# Patient Record
Sex: Female | Born: 1955 | Race: White | Hispanic: No | Marital: Single | Smoking: Never smoker
Health system: Southern US, Community
[De-identification: ages and names within clinical notes are randomized; demographics above are authoritative.]

---

## 1997-11-22 ENCOUNTER — Other Ambulatory Visit: Admission: RE | Admit: 1997-11-22 | Discharge: 1997-11-22 | Payer: Self-pay | Admitting: Obstetrics and Gynecology

## 1998-01-19 ENCOUNTER — Other Ambulatory Visit: Admission: RE | Admit: 1998-01-19 | Discharge: 1998-01-19 | Payer: Self-pay | Admitting: Obstetrics and Gynecology

## 1998-12-30 ENCOUNTER — Other Ambulatory Visit: Admission: RE | Admit: 1998-12-30 | Discharge: 1998-12-30 | Payer: Self-pay | Admitting: Obstetrics and Gynecology

## 2000-02-05 ENCOUNTER — Other Ambulatory Visit: Admission: RE | Admit: 2000-02-05 | Discharge: 2000-02-05 | Payer: Self-pay | Admitting: Obstetrics and Gynecology

## 2001-02-14 ENCOUNTER — Other Ambulatory Visit: Admission: RE | Admit: 2001-02-14 | Discharge: 2001-02-14 | Payer: Self-pay | Admitting: Obstetrics and Gynecology

## 2002-02-24 ENCOUNTER — Other Ambulatory Visit: Admission: RE | Admit: 2002-02-24 | Discharge: 2002-02-24 | Payer: Self-pay | Admitting: Obstetrics and Gynecology

## 2003-04-06 ENCOUNTER — Other Ambulatory Visit: Admission: RE | Admit: 2003-04-06 | Discharge: 2003-04-06 | Payer: Self-pay | Admitting: Obstetrics and Gynecology

## 2003-05-17 ENCOUNTER — Other Ambulatory Visit: Admission: RE | Admit: 2003-05-17 | Discharge: 2003-05-17 | Payer: Self-pay | Admitting: Obstetrics and Gynecology

## 2003-11-07 ENCOUNTER — Emergency Department (HOSPITAL_COMMUNITY): Admission: EM | Admit: 2003-11-07 | Discharge: 2003-11-07 | Payer: Self-pay | Admitting: *Deleted

## 2003-11-08 ENCOUNTER — Ambulatory Visit (HOSPITAL_COMMUNITY): Admission: RE | Admit: 2003-11-08 | Discharge: 2003-11-08 | Payer: Self-pay | Admitting: *Deleted

## 2003-12-03 ENCOUNTER — Observation Stay (HOSPITAL_COMMUNITY): Admission: RE | Admit: 2003-12-03 | Discharge: 2003-12-04 | Payer: Self-pay | Admitting: General Surgery

## 2003-12-03 ENCOUNTER — Encounter (INDEPENDENT_AMBULATORY_CARE_PROVIDER_SITE_OTHER): Payer: Self-pay | Admitting: *Deleted

## 2004-04-21 ENCOUNTER — Other Ambulatory Visit: Admission: RE | Admit: 2004-04-21 | Discharge: 2004-04-21 | Payer: Self-pay | Admitting: Obstetrics and Gynecology

## 2005-05-21 ENCOUNTER — Other Ambulatory Visit: Admission: RE | Admit: 2005-05-21 | Discharge: 2005-05-21 | Payer: Self-pay | Admitting: Obstetrics and Gynecology

## 2006-06-03 ENCOUNTER — Other Ambulatory Visit: Admission: RE | Admit: 2006-06-03 | Discharge: 2006-06-03 | Payer: Self-pay | Admitting: Obstetrics and Gynecology

## 2006-07-08 ENCOUNTER — Emergency Department (HOSPITAL_COMMUNITY): Admission: EM | Admit: 2006-07-08 | Discharge: 2006-07-09 | Payer: Self-pay | Admitting: Emergency Medicine

## 2007-06-25 ENCOUNTER — Other Ambulatory Visit: Admission: RE | Admit: 2007-06-25 | Discharge: 2007-06-25 | Payer: Self-pay | Admitting: Obstetrics and Gynecology

## 2008-07-28 ENCOUNTER — Ambulatory Visit: Payer: Self-pay | Admitting: Obstetrics and Gynecology

## 2008-07-28 ENCOUNTER — Encounter: Payer: Self-pay | Admitting: Obstetrics and Gynecology

## 2008-07-28 ENCOUNTER — Other Ambulatory Visit: Admission: RE | Admit: 2008-07-28 | Discharge: 2008-07-28 | Payer: Self-pay | Admitting: Obstetrics and Gynecology

## 2008-08-02 ENCOUNTER — Ambulatory Visit: Payer: Self-pay | Admitting: Obstetrics and Gynecology

## 2009-08-18 ENCOUNTER — Other Ambulatory Visit: Admission: RE | Admit: 2009-08-18 | Discharge: 2009-08-18 | Payer: Self-pay | Admitting: Family Medicine

## 2009-12-02 ENCOUNTER — Emergency Department (HOSPITAL_COMMUNITY): Admission: EM | Admit: 2009-12-02 | Discharge: 2009-12-02 | Payer: Self-pay | Admitting: Emergency Medicine

## 2010-08-23 ENCOUNTER — Other Ambulatory Visit (HOSPITAL_COMMUNITY)
Admission: RE | Admit: 2010-08-23 | Discharge: 2010-08-23 | Disposition: A | Discharge status: Home / Self Care | Payer: BC Managed Care – PPO | Source: Ambulatory Visit | Attending: Family Medicine | Admitting: Family Medicine

## 2010-08-23 ENCOUNTER — Other Ambulatory Visit: Payer: Self-pay | Admitting: Family Medicine

## 2010-08-23 DIAGNOSIS — Z124 Encounter for screening for malignant neoplasm of cervix: Secondary | ICD-10-CM | POA: Insufficient documentation

## 2010-09-08 NOTE — Op Note (Signed)
NAME:  Amy Mcmahon, Amy Mcmahon NO.:  [id number]   MEDICAL RECORD NO.:  [id number]                   PATIENT TYPE:  OBV   LOCATION:  0480                                 FACILITY:  [facility]   PHYSICIAN:  Angelia Mould. Derrell Lolling, M.D.             DATE OF BIRTH:  1955/08/09   DATE OF PROCEDURE:  12/03/2003  DATE OF DISCHARGE:  12/04/2003                                 OPERATIVE REPORT   PREOPERATIVE DIAGNOSES:  Chronic cholecystitis with cholelithiasis.   POSTOPERATIVE DIAGNOSES:  Chronic cholecystitis with cholelithiasis.   OPERATION PERFORMED:  Laparoscopic cholecystectomy with intraoperative  cholangiogram.   SURGEON:  Dr. Claud Kelp   FIRST ASSISTANT:  Dr. Lorelee New   OPERATIVE INDICATIONS:  This is a 55 year old white female, who presented  with a 2 week history of daily episodes of postprandial epigastric pain.  She had a severe attack of pain and repeated vomiting several days prior to  evaluation, and she went to the emergency room where she had a white count  of 15,700, hemoglobin 12.3, total bilirubin of 2.1, slightly elevated liver  function tests, and normal lipase.  She was sent home and returned later for  an ultrasound on November 08, 2003, which showed numerous gallstones and normal  gallbladder wall thickening.  Dr. Lajean Manes asked me to see her.  She was  evaluated in the office recently.  She was brought to the operating room  electively.   OPERATIVE FINDINGS:  The gallbladder was chronically inflamed, had some  adhesions to it.  The cholangiogram was basically unremarkable and showed  normal intrahepatic and extrahepatic biliary anatomy.  Good opacification.  No filling defects.  Excellent flow of contrast into the duodenum.  The  gallbladder bed was a little bit raw at the end of the case, but bleeding  was well controlled, and there was no bile leak.   OPERATIVE TECHNIQUE:  This operative report is dictated in retrospect.  Exact  details of the surgery cannot be remembered.  This could not be  dictated on the day of surgery because the dictation system was broken.   Following the induction of general endotracheal anesthesia, the patient's  abdomen was prepped and draped in a sterile fashion.  A small incision was  made at the umbilicus.  The fascia was incised in the midline.  The  abdominal cavity was entered under direct vision.  A 10 mm Hasson trocar was  inserted and secured with a pursestring suture of 0 Vicryl.  Pneumoperitoneum was created.  Other than the abnormal gallbladder, we found  grossly abnormal findings.  A 10 mm trocar was placed in the subxiphoid  region and two 5 mm trocars placed in the right mid abdomen.  The  gallbladder fundus was elevated.  We took down all the adhesions from the  gallbladder.  We identified the infundibulum of the gallbladder and  retracted it laterally.  We dissected out the cystic duct and the cystic  artery.  The cystic artery was isolated as it went onto the wall of the  gallbladder, secured with metal clips, and divided.  A large window was  created behind the cystic duct.  A metal clip was placed on the cystic duct.  Cholangiogram catheter was inserted into the cystic duct and a cholangiogram  obtained using the C-arm.  This cholangiogram was completely normal as  described above.  No anatomic variance, no filling defects, and no  obstruction.  The cholangiogram catheter was removed.  The cystic duct was  secured with metal clips and divided.  The gallbladder was dissected from  its bed with electrocautery and removed through the umbilical port.  The  operative field was copiously irrigated.  Some minor oozing in the bed of  the gallbladder was controlled nicely with electrocautery.  At the  completion of the case, the irrigation fluid was completely clear, and there  was no bleeding and no bile leak whatsoever.  The trocars were removed under  direct vision, and  there was no bleeding from the trocar sites.  The  pneumoperitoneum was released.  The fascia at the umbilicus was closed with  0 Vicryl sutures.  Skin incisions were closed with subcuticular sutures of 4-  0 Vicryl and Steri-Strips.  Clean bandages were placed and the patient taken  to the recovery room in stable condition.  Estimated blood loss was about 20  mL.  Complications none.  Sponge, needle, and instrument counts were  correct.      HMI/MEDQ  D:  01/10/2004  T:  01/10/2004  Job:  [id number]

## 2011-05-16 ENCOUNTER — Ambulatory Visit (INDEPENDENT_AMBULATORY_CARE_PROVIDER_SITE_OTHER): Payer: BC Managed Care – PPO

## 2011-05-16 DIAGNOSIS — R1110 Vomiting, unspecified: Secondary | ICD-10-CM

## 2011-05-16 DIAGNOSIS — G43009 Migraine without aura, not intractable, without status migrainosus: Secondary | ICD-10-CM

## 2012-10-20 ENCOUNTER — Ambulatory Visit: Payer: BC Managed Care – PPO | Attending: Family Medicine | Admitting: Physical Therapy

## 2012-10-20 DIAGNOSIS — IMO0001 Reserved for inherently not codable concepts without codable children: Secondary | ICD-10-CM | POA: Insufficient documentation

## 2012-10-20 DIAGNOSIS — M546 Pain in thoracic spine: Secondary | ICD-10-CM | POA: Insufficient documentation

## 2012-10-20 DIAGNOSIS — M545 Low back pain, unspecified: Secondary | ICD-10-CM | POA: Insufficient documentation

## 2012-10-22 ENCOUNTER — Ambulatory Visit: Payer: BC Managed Care – PPO | Attending: Family Medicine | Admitting: Physical Therapy

## 2012-10-22 DIAGNOSIS — M545 Low back pain, unspecified: Secondary | ICD-10-CM | POA: Insufficient documentation

## 2012-10-22 DIAGNOSIS — M546 Pain in thoracic spine: Secondary | ICD-10-CM | POA: Insufficient documentation

## 2012-10-22 DIAGNOSIS — M4120 Other idiopathic scoliosis, site unspecified: Secondary | ICD-10-CM | POA: Insufficient documentation

## 2012-10-22 DIAGNOSIS — IMO0001 Reserved for inherently not codable concepts without codable children: Secondary | ICD-10-CM | POA: Insufficient documentation

## 2012-10-27 ENCOUNTER — Ambulatory Visit: Payer: BC Managed Care – PPO | Admitting: Physical Therapy

## 2012-10-29 ENCOUNTER — Encounter: Payer: BC Managed Care – PPO | Admitting: Physical Therapy

## 2012-10-29 ENCOUNTER — Ambulatory Visit: Payer: BC Managed Care – PPO

## 2012-10-30 ENCOUNTER — Encounter: Payer: BC Managed Care – PPO | Admitting: Physical Therapy

## 2012-11-03 ENCOUNTER — Encounter: Payer: BC Managed Care – PPO | Admitting: Physical Therapy

## 2012-11-05 ENCOUNTER — Ambulatory Visit: Payer: BC Managed Care – PPO | Admitting: Physical Therapy

## 2012-11-05 ENCOUNTER — Encounter: Payer: BC Managed Care – PPO | Admitting: Physical Therapy

## 2012-11-07 ENCOUNTER — Ambulatory Visit: Payer: BC Managed Care – PPO | Admitting: Physical Therapy

## 2012-11-10 ENCOUNTER — Ambulatory Visit: Payer: BC Managed Care – PPO | Admitting: Physical Therapy

## 2012-11-12 ENCOUNTER — Ambulatory Visit: Payer: BC Managed Care – PPO | Admitting: Physical Therapy

## 2012-11-17 ENCOUNTER — Ambulatory Visit: Payer: BC Managed Care – PPO | Admitting: Physical Therapy

## 2012-11-19 ENCOUNTER — Ambulatory Visit: Payer: BC Managed Care – PPO | Admitting: Physical Therapy

## 2012-11-24 ENCOUNTER — Ambulatory Visit: Payer: BC Managed Care – PPO | Attending: Family Medicine | Admitting: Physical Therapy

## 2012-11-24 DIAGNOSIS — M4120 Other idiopathic scoliosis, site unspecified: Secondary | ICD-10-CM | POA: Insufficient documentation

## 2012-11-24 DIAGNOSIS — M546 Pain in thoracic spine: Secondary | ICD-10-CM | POA: Insufficient documentation

## 2012-11-24 DIAGNOSIS — IMO0001 Reserved for inherently not codable concepts without codable children: Secondary | ICD-10-CM | POA: Insufficient documentation

## 2012-11-24 DIAGNOSIS — M545 Low back pain, unspecified: Secondary | ICD-10-CM | POA: Insufficient documentation

## 2013-05-10 ENCOUNTER — Encounter (HOSPITAL_COMMUNITY): Payer: Self-pay | Admitting: Emergency Medicine

## 2013-05-10 ENCOUNTER — Emergency Department (HOSPITAL_COMMUNITY)
Admission: EM | Admit: 2013-05-10 | Discharge: 2013-05-10 | Disposition: A | Discharge status: Home / Self Care | Payer: BC Managed Care – PPO | Source: Home / Self Care | Attending: Emergency Medicine | Admitting: Emergency Medicine

## 2013-05-10 DIAGNOSIS — G43909 Migraine, unspecified, not intractable, without status migrainosus: Secondary | ICD-10-CM

## 2013-05-10 MED ORDER — KETOROLAC TROMETHAMINE 30 MG/ML IJ SOLN
INTRAMUSCULAR | Status: AC
  Filled 2013-05-10: qty 1

## 2013-05-10 MED ORDER — DIPHENHYDRAMINE HCL 50 MG/ML IJ SOLN
25.0000 mg | Freq: Once | INTRAMUSCULAR | Status: AC
  Administered 2013-05-10: 25 mg via INTRAMUSCULAR

## 2013-05-10 MED ORDER — KETOROLAC TROMETHAMINE 30 MG/ML IJ SOLN: 30.0000 mg | Freq: Once | INTRAMUSCULAR | Status: DC

## 2013-05-10 MED ORDER — ONDANSETRON 4 MG PO TBDP
8.0000 mg | ORAL_TABLET | Freq: Once | ORAL | Status: AC
  Administered 2013-05-10: 8 mg via ORAL

## 2013-05-10 MED ORDER — ONDANSETRON 4 MG PO TBDP
ORAL_TABLET | ORAL | Status: AC
  Filled 2013-05-10: qty 2

## 2013-05-10 MED ORDER — KETOROLAC TROMETHAMINE 30 MG/ML IJ SOLN
30.0000 mg | Freq: Once | INTRAMUSCULAR | Status: AC
  Administered 2013-05-10: 30 mg via INTRAMUSCULAR

## 2013-05-10 MED ORDER — DIPHENHYDRAMINE HCL 50 MG/ML IJ SOLN
INTRAMUSCULAR | Status: AC
  Filled 2013-05-10: qty 1

## 2013-05-10 MED ORDER — ONDANSETRON 4 MG PO TBDP: ORAL_TABLET | ORAL | Status: AC

## 2013-05-10 NOTE — Discharge Instructions (Signed)
Migraine Headache A migraine headache is an intense, throbbing pain on one or both sides of your head. A migraine can last for 30 minutes to several hours. CAUSES  The exact cause of a migraine headache is not always known. However, a migraine may be caused when nerves in the brain become irritated and release chemicals that cause inflammation. This causes pain. Certain things may also trigger migraines, such as:  Alcohol.  Smoking.  Stress.  Menstruation.  Aged cheeses.  Foods or drinks that contain nitrates, glutamate, aspartame, or tyramine.  Lack of sleep.  Chocolate.  Caffeine.  Hunger.  Physical exertion.  Fatigue.  Medicines used to treat chest pain (nitroglycerine), birth control pills, estrogen, and some blood pressure medicines. SIGNS AND SYMPTOMS  Pain on one or both sides of your head.  Pulsating or throbbing pain.  Severe pain that prevents daily activities.  Pain that is aggravated by any physical activity.  Nausea, vomiting, or both.  Dizziness.  Pain with exposure to bright lights, loud noises, or activity.  General sensitivity to bright lights, loud noises, or smells. Before you get a migraine, you may get warning signs that a migraine is coming (aura). An aura may include:  Seeing flashing lights.  Seeing bright spots, halos, or zig-zag lines.  Having tunnel vision or blurred vision.  Having feelings of numbness or tingling.  Having trouble talking.  Having muscle weakness. DIAGNOSIS  A migraine headache is often diagnosed based on:  Symptoms.  Physical exam.  A CT scan or MRI of your head. These imaging tests cannot diagnose migraines, but they can help rule out other causes of headaches. TREATMENT Medicines may be given for pain and nausea. Medicines can also be given to help prevent recurrent migraines.  HOME CARE INSTRUCTIONS  Only take over-the-counter or prescription medicines for pain or discomfort as directed by your  health care provider. The use of long-term narcotics is not recommended.  Lie down in a dark, quiet room when you have a migraine.  Keep a journal to find out what may trigger your migraine headaches. For example, write down:  What you eat and drink.  How much sleep you get.  Any change to your diet or medicines.  Limit alcohol consumption.  Quit smoking if you smoke.  Get 7 9 hours of sleep, or as recommended by your health care provider.  Limit stress.  Keep lights dim if bright lights bother you and make your migraines worse. SEEK IMMEDIATE MEDICAL CARE IF:   Your migraine becomes severe.  You have a fever.  You have a stiff neck.  You have vision loss.  You have muscular weakness or loss of muscle control.  You start losing your balance or have trouble walking.  You feel faint or pass out.  You have severe symptoms that are different from your first symptoms. MAKE SURE YOU:   Understand these instructions.  Will watch your condition.  Will get help right away if you are not doing well or get worse. Document Released: 04/09/2005 Document Revised: 01/28/2013 Document Reviewed: 12/15/2012 [employer] Patient Information 2014 [employer].  

## 2013-05-10 NOTE — ED Notes (Signed)
Patient here for frontal head pressure that started yesterday Today it got worse with the pressure and vomiting States has a history of migraines but this is different  Her migraines are usually over her right eye This pressure is centered more near her sinus

## 2013-05-10 NOTE — ED Provider Notes (Signed)
CSN: [id number]     Arrival date & time 05/10/13  1833 History   First MD Initiated Contact with Patient 05/10/13 1914     Chief Complaint  Patient presents with  . Migraine   (Consider location/radiation/quality/duration/timing/severity/associated sxs/prior Treatment) HPI Comments: Frontal migraine HA that began yesterday. Patient to one dose of Maxalt today at 11:00am and phenergan suppository at 12:30pm today and continues to have headache, phonophobia, photophobia, nausea and vomiting. Denies changes in strength, sensation, vision, speech or balance.  PCP: Sadie Haber @ [place] Denies recent illness or injury  Patient is a 58 y.o. female presenting with migraines. The history is provided by the patient.  Migraine This is a recurrent problem. The current episode started yesterday. The problem occurs constantly. The problem has been gradually worsening.    No past medical history on file. No past surgical history on file. No family history on file. History  Substance Use Topics  . Smoking status: Not on file  . Smokeless tobacco: Not on file  . Alcohol Use: Not on file   OB History   No data available     Review of Systems  All other systems reviewed and are negative.    Allergies  Codeine  Home Medications   Current Outpatient Rx  Name  Route  Sig  Dispense  Refill  . imipramine (TOFRANIL) 50 MG tablet   Oral   Take 50 mg by mouth at bedtime.         . medroxyPROGESTERone (PROVERA) 2.5 MG tablet   Oral   Take 2.5 mg by mouth daily.         . metoprolol succinate (TOPROL-XL) 100 MG 24 hr tablet   Oral   Take 100 mg by mouth daily. Take with or immediately following a meal.         . promethazine (PHENERGAN) 25 MG tablet   Oral   Take 25 mg by mouth every 6 (six) hours as needed for nausea or vomiting.         . rizatriptan (MAXALT) 10 MG tablet   Oral   Take 10 mg by mouth as needed for migraine. May repeat in 2 hours if needed          BP  149/77  Pulse 75  Temp(Src) 97.3 F (36.3 C) (Oral)  Resp 18  SpO2 97% Physical Exam  Nursing note and vitals reviewed. Constitutional: She is oriented to person, place, and time. She appears well-developed and well-nourished. No distress.  HENT:  Head: Normocephalic and atraumatic.  Right Ear: Hearing, tympanic membrane, external ear and ear canal normal.  Left Ear: Hearing, tympanic membrane, external ear and ear canal normal.  Nose: Nose normal.  Mouth/Throat: Uvula is midline, oropharynx is clear and moist and mucous membranes are normal.  Eyes: Conjunctivae, EOM and lids are normal. Pupils are equal, round, and reactive to light. Right eye exhibits no discharge. Left eye exhibits no discharge. No scleral icterus.  Neck: Normal range of motion and full passive range of motion without pain. Neck supple.  Cardiovascular: Normal rate, regular rhythm and normal heart sounds.   Pulmonary/Chest: Effort normal and breath sounds normal.  Abdominal: Soft. Bowel sounds are normal. She exhibits no distension. There is no tenderness.  Musculoskeletal: Normal range of motion.  Lymphadenopathy:    She has no cervical adenopathy.  Neurological: She is alert and oriented to person, place, and time. She has normal strength. No cranial nerve deficit or sensory deficit. Coordination and gait normal.  GCS eye subscore is 4. GCS verbal subscore is 5. GCS motor subscore is 6.  Skin: Skin is warm and dry.  Psychiatric: She has a normal mood and affect. Her behavior is normal.    ED Course  Procedures (including critical care time) Labs Review Labs Reviewed - No data to display Imaging Review No results found.  EKG Interpretation    Date/Time:    Ventricular Rate:    PR Interval:    QRS Duration:   QT Interval:    QTC Calculation:   R Axis:     Text Interpretation:              MDM  Symptoms appear to be consistent  with patient's usual migraine headache process. No clinical  evidence of acute intracranial process.  Re-evaluation after IM Toradol, benadryl and ODT zofran: Patient reports near resolution of nausea. Is tolerating oral fluids. Headache improving. Has friend available to drive her home.  Will send with Rx for ODT zofran for use at home and advise follow up if symptoms return or if she is unable to keep clear liquids down despite zofran use.  Cautioned patient that if symptoms become suddenly worse or severe, she should report to her nearest ER for assistance.   [place], [place] 05/10/13 2009

## 2013-05-10 NOTE — ED Provider Notes (Signed)
Medical screening examination/treatment/procedure(s) were performed by non-physician practitioner and as supervising physician I was immediately available for consultation/collaboration.  Philipp Deputy, M.D.  Harden Mo, MD 05/10/13 2136

## 2013-08-09 ENCOUNTER — Encounter (HOSPITAL_COMMUNITY): Payer: Self-pay | Admitting: Emergency Medicine

## 2013-08-09 ENCOUNTER — Emergency Department (HOSPITAL_COMMUNITY)
Admission: EM | Admit: 2013-08-09 | Discharge: 2013-08-09 | Disposition: A | Discharge status: Home / Self Care | Payer: BC Managed Care – PPO | Source: Home / Self Care | Attending: Emergency Medicine | Admitting: Emergency Medicine

## 2013-08-09 DIAGNOSIS — G43909 Migraine, unspecified, not intractable, without status migrainosus: Secondary | ICD-10-CM

## 2013-08-09 MED ORDER — DIPHENHYDRAMINE HCL 50 MG/ML IJ SOLN
INTRAMUSCULAR | Status: AC
  Filled 2013-08-09: qty 1

## 2013-08-09 MED ORDER — KETOROLAC TROMETHAMINE 60 MG/2ML IM SOLN
60.0000 mg | Freq: Once | INTRAMUSCULAR | Status: AC
  Administered 2013-08-09: 60 mg via INTRAMUSCULAR

## 2013-08-09 MED ORDER — DIPHENHYDRAMINE HCL 50 MG/ML IJ SOLN
50.0000 mg | Freq: Once | INTRAMUSCULAR | Status: AC
  Administered 2013-08-09: 50 mg via INTRAMUSCULAR

## 2013-08-09 MED ORDER — ONDANSETRON 4 MG PO TBDP
ORAL_TABLET | ORAL | Status: AC
  Filled 2013-08-09: qty 1

## 2013-08-09 MED ORDER — KETOROLAC TROMETHAMINE 60 MG/2ML IM SOLN
INTRAMUSCULAR | Status: AC
  Filled 2013-08-09: qty 2

## 2013-08-09 MED ORDER — ONDANSETRON 4 MG PO TBDP
4.0000 mg | ORAL_TABLET | Freq: Once | ORAL | Status: AC
  Administered 2013-08-09: 4 mg via ORAL

## 2013-08-09 NOTE — ED Notes (Signed)
Pt comes in with c/o frontal migraine h/a that started this morning. States she has hx migraines. Takes Maxalt for sx.  States she took med on empty stomach with vomiting. Pt also took Zofran,Promethazine fro relief. 9/10 pounding h/a

## 2013-08-09 NOTE — ED Provider Notes (Signed)
CSN: [id number]     Arrival date & time 08/09/13  1220 History   First MD Initiated Contact with Patient 08/09/13 1501     Chief Complaint  Patient presents with  . 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 Woke up with typical migraine, took own maxalt but vomiting immediately afterward. Feels own meds not working. Requests toradol and something for nausea. LAst migraine 3 months ago.   Patient is a 58 y.o. female presenting with headaches. The history is provided by the patient.  Headache Pain location:  Frontal Quality:  Dull Radiates to: R eye. Severity currently:  9/10 Onset quality:  Gradual (woke up with migraine) Duration: since woke up this morning. Timing:  Constant Progression:  Unchanged Chronicity:  Recurrent Similar to prior headaches: yes   Context: bright light and loud noise   Context comment:  Smells Relieved by:  Nothing Worsened by:  Light and sound Ineffective treatments: own meds; maxalt, zofran, phenergan. Associated symptoms: nausea, photophobia and vomiting   Associated symptoms: no abdominal pain, no blurred vision, no congestion, no drainage, no fever, no numbness, no sinus pressure and no visual change     History reviewed. No pertinent past medical history. History reviewed. No pertinent past surgical history. History reviewed. No pertinent family history. History  Substance Use Topics  . Smoking status: Never Smoker   . Smokeless tobacco: Not on file  . Alcohol Use: Yes   OB History   Grav Para Term Preterm Abortions TAB SAB Ect Mult Living                 Review of Systems  Constitutional: Negative for fever and chills.  HENT: Negative for congestion, postnasal drip and sinus pressure.   Eyes: Positive for photophobia. Negative for blurred vision and  visual disturbance.  Gastrointestinal: Positive for nausea and vomiting. Negative for abdominal pain.  Neurological: Positive for headaches. Negative for numbness.    Allergies  Codeine  Home Medications   Prior to Admission medications   Medication Sig Start Date End Date Taking? Authorizing Provider  estradiol (VIVELLE-DOT) 0.025 MG/24HR Place 1 patch onto the skin 2 (two) times a week.   Yes Historical Provider, MD  medroxyPROGESTERone (PROVERA) 2.5 MG tablet Take 2.5 mg by mouth daily.   Yes Historical Provider, MD  metoprolol succinate (TOPROL-XL) 100 MG 24 hr tablet Take 100 mg by mouth daily. Take with or immediately following a meal.   Yes Historical Provider, MD  ondansetron (ZOFRAN ODT) 4 MG disintegrating tablet 1-2 tabs ODT q8hrs prn nausea/vomiting 05/10/13  Yes Annett Gula Presson, PA  promethazine (PHENERGAN) 25 MG tablet Take 25 mg by mouth every 6 (six) hours as needed for nausea or vomiting.   Yes Historical Provider, MD  rizatriptan (MAXALT) 10 MG tablet Take 10 mg by mouth as needed for migraine. May repeat in 2 hours if needed   Yes Historical Provider, MD  imipramine (TOFRANIL) 50 MG tablet Take 50 mg by mouth at bedtime.    Historical Provider, MD   BP 145/83  Pulse 72  Temp(Src) 97.8 F (36.6 C) (Oral)  Resp 16  SpO2 100% Physical Exam  Constitutional: She is oriented to person, place, and time. She appears well-developed and well-nourished.  Appears in pain; sitting in darkened exam room   Eyes: Conjunctivae and EOM are normal. Pupils are equal, round, and reactive to light.  Cardiovascular: Normal rate and regular rhythm.   Pulmonary/Chest: Effort normal and breath sounds normal.  Neurological: She is alert and oriented to person, place, and time. She has normal strength. Gait normal.    ED Course  Procedures (including critical care time) Labs Review Labs Reviewed - No data to display  No results found for this or any previous visit. Imaging  Review No results found.   MDM   1. Migraine   pt given toradol 60mg  IM, benadryl 50mg  IM, zofran odt 4mg  here at [facility]. Has ride home.      Carvel Getting, NP 08/09/13 [phone]

## 2013-08-09 NOTE — Discharge Instructions (Signed)
Migraine Headache A migraine headache is an intense, throbbing pain on one or both sides of your head. A migraine can last for 30 minutes to several hours. CAUSES  The exact cause of a migraine headache is not always known. However, a migraine may be caused when nerves in the brain become irritated and release chemicals that cause inflammation. This causes pain. Certain things may also trigger migraines, such as:  Alcohol.  Smoking.  Stress.  Menstruation.  Aged cheeses.  Foods or drinks that contain nitrates, glutamate, aspartame, or tyramine.  Lack of sleep.  Chocolate.  Caffeine.  Hunger.  Physical exertion.  Fatigue.  Medicines used to treat chest pain (nitroglycerine), birth control pills, estrogen, and some blood pressure medicines. SIGNS AND SYMPTOMS  Pain on one or both sides of your head.  Pulsating or throbbing pain.  Severe pain that prevents daily activities.  Pain that is aggravated by any physical activity.  Nausea, vomiting, or both.  Dizziness.  Pain with exposure to bright lights, loud noises, or activity.  General sensitivity to bright lights, loud noises, or smells. Before you get a migraine, you may get warning signs that a migraine is coming (aura). An aura may include:  Seeing flashing lights.  Seeing bright spots, halos, or zig-zag lines.  Having tunnel vision or blurred vision.  Having feelings of numbness or tingling.  Having trouble talking.  Having muscle weakness. DIAGNOSIS  A migraine headache is often diagnosed based on:  Symptoms.  Physical exam.  A CT scan or MRI of your head. These imaging tests cannot diagnose migraines, but they can help rule out other causes of headaches. TREATMENT Medicines may be given for pain and nausea. Medicines can also be given to help prevent recurrent migraines.  HOME CARE INSTRUCTIONS  Only take over-the-counter or prescription medicines for pain or discomfort as directed by your  health care provider. The use of long-term narcotics is not recommended.  Lie down in a dark, quiet room when you have a migraine.  Keep a journal to find out what may trigger your migraine headaches. For example, write down:  What you eat and drink.  How much sleep you get.  Any change to your diet or medicines.  Limit alcohol consumption.  Quit smoking if you smoke.  Get 7 9 hours of sleep, or as recommended by your health care provider.  Limit stress.  Keep lights dim if bright lights bother you and make your migraines worse. SEEK IMMEDIATE MEDICAL CARE IF:   Your migraine becomes severe.  You have a fever.  You have a stiff neck.  You have vision loss.  You have muscular weakness or loss of muscle control.  You start losing your balance or have trouble walking.  You feel faint or pass out.  You have severe symptoms that are different from your first symptoms. MAKE SURE YOU:   Understand these instructions.  Will watch your condition.  Will get help right away if you are not doing well or get worse. Document Released: 04/09/2005 Document Revised: 01/28/2013 Document Reviewed: 12/15/2012 [employer] Patient Information 2014 [employer].  

## 2013-08-10 NOTE — ED Provider Notes (Signed)
Medical screening examination/treatment/procedure(s) were performed by non-physician practitioner and as supervising physician I was immediately available for consultation/collaboration.  Philipp Deputy, M.D.  Harden Mo, MD 08/10/13 [phone]

## 2013-10-19 ENCOUNTER — Other Ambulatory Visit (HOSPITAL_COMMUNITY)
Admission: RE | Admit: 2013-10-19 | Discharge: 2013-10-19 | Disposition: A | Discharge status: Home / Self Care | Payer: BC Managed Care – PPO | Source: Ambulatory Visit | Attending: Family Medicine | Admitting: Family Medicine

## 2013-10-19 ENCOUNTER — Other Ambulatory Visit: Payer: Self-pay | Admitting: Family Medicine

## 2013-10-19 DIAGNOSIS — Z1151 Encounter for screening for human papillomavirus (HPV): Secondary | ICD-10-CM | POA: Insufficient documentation

## 2013-10-19 DIAGNOSIS — Z124 Encounter for screening for malignant neoplasm of cervix: Secondary | ICD-10-CM | POA: Insufficient documentation

## 2013-10-20 LAB — CYTOLOGY - PAP

## 2015-10-26 ENCOUNTER — Other Ambulatory Visit: Payer: Self-pay | Admitting: Family Medicine

## 2015-10-26 DIAGNOSIS — J3489 Other specified disorders of nose and nasal sinuses: Secondary | ICD-10-CM

## 2015-10-27 ENCOUNTER — Ambulatory Visit
Admission: RE | Admit: 2015-10-27 | Discharge: 2015-10-27 | Disposition: A | Discharge status: Home / Self Care | Payer: BC Managed Care – PPO | Source: Ambulatory Visit | Attending: Family Medicine | Admitting: Family Medicine

## 2015-10-27 DIAGNOSIS — J3489 Other specified disorders of nose and nasal sinuses: Secondary | ICD-10-CM

## 2016-11-13 ENCOUNTER — Other Ambulatory Visit: Payer: Self-pay | Admitting: Family Medicine

## 2016-11-13 ENCOUNTER — Other Ambulatory Visit (HOSPITAL_COMMUNITY)
Admission: RE | Admit: 2016-11-13 | Discharge: 2016-11-13 | Disposition: A | Discharge status: Home / Self Care | Payer: BC Managed Care – PPO | Source: Ambulatory Visit | Attending: Family Medicine | Admitting: Family Medicine

## 2016-11-13 DIAGNOSIS — Z1151 Encounter for screening for human papillomavirus (HPV): Secondary | ICD-10-CM | POA: Diagnosis present

## 2016-11-13 DIAGNOSIS — Z01411 Encounter for gynecological examination (general) (routine) with abnormal findings: Secondary | ICD-10-CM | POA: Diagnosis present

## 2016-11-14 LAB — CYTOLOGY - PAP
DIAGNOSIS: NEGATIVE
HPV: NOT DETECTED

## 2017-09-08 IMAGING — CT CT MAXILLOFACIAL W/O CM
4 of 5 series · 17 of 37 positions shown, 19 images · non-contrast
Comparison: None.

CLINICAL DATA: Hit in the face by a dog 3 weeks ago with nasal pain
and swelling, some vertigo

EXAM:
CT MAXILLOFACIAL WITHOUT CONTRAST
TECHNIQUE: Multidetector CT imaging of the maxillofacial structures was
performed. Multiplanar CT image reconstructions were also generated.
A small metallic BB was placed on the right temple in order to
reliably differentiate right from left.

[Series 4: max bone · axial · 0.33mm/px · z∈[-78,+37]mm · 5 of 70 slices shown, 7 images]
[im 12/70  brain]
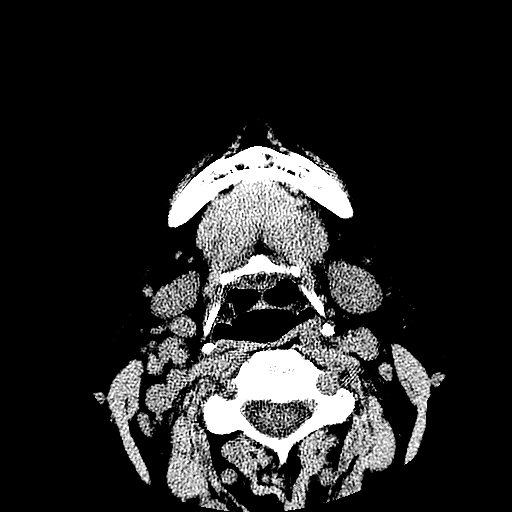
[im 12/70  bone]
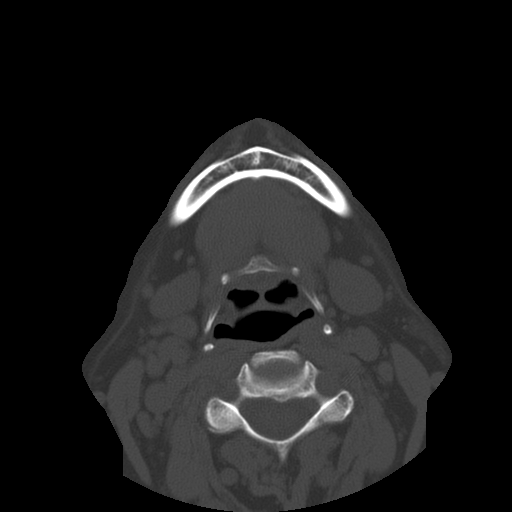
[im 24/70  bone]
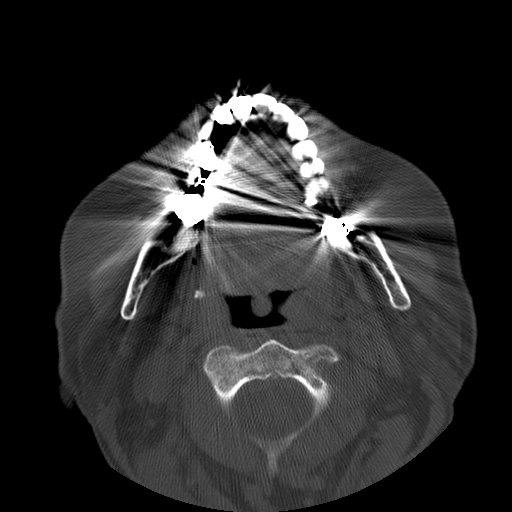
[im 35/70  bone]
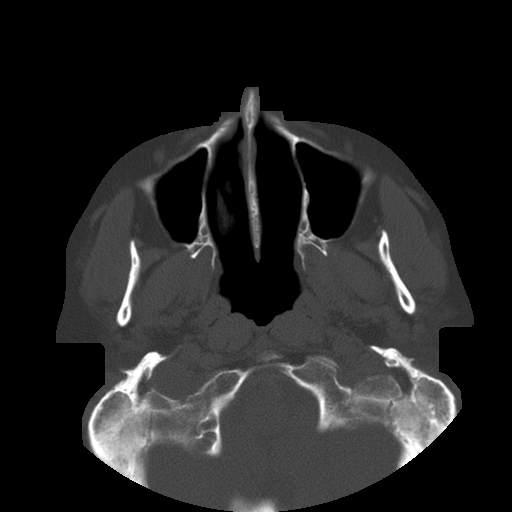
[im 47/70  bone]
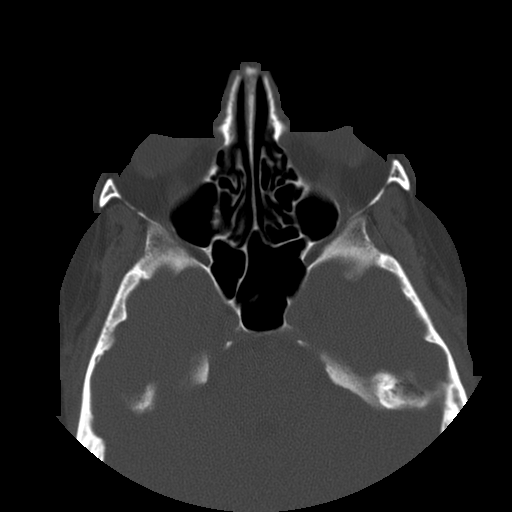
[im 58/70  brain]
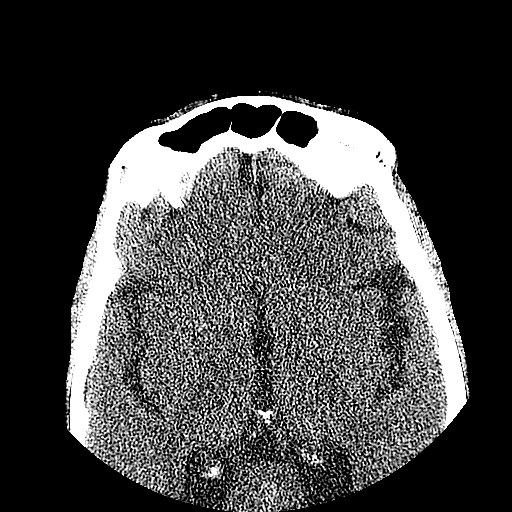
[im 58/70  bone]
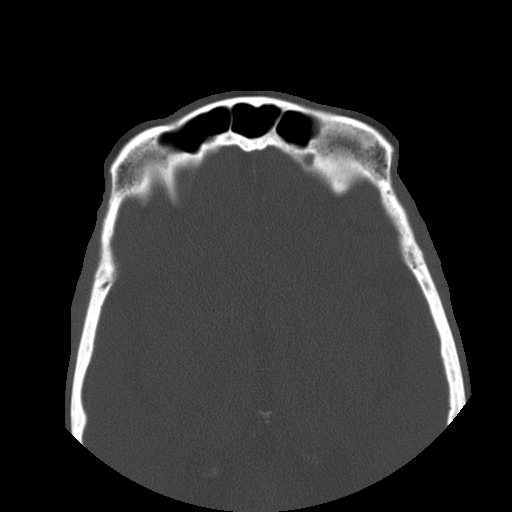

[Series 601: coronal facial · coronal · 0.34mm/px · 3 of 84 slices shown]
[im 28/84  bone]
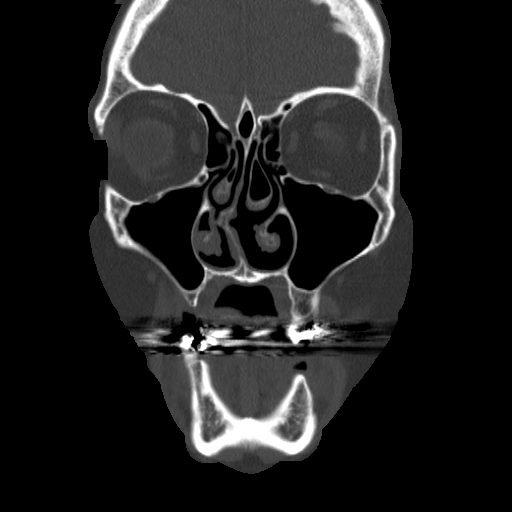
[im 42/84  bone]
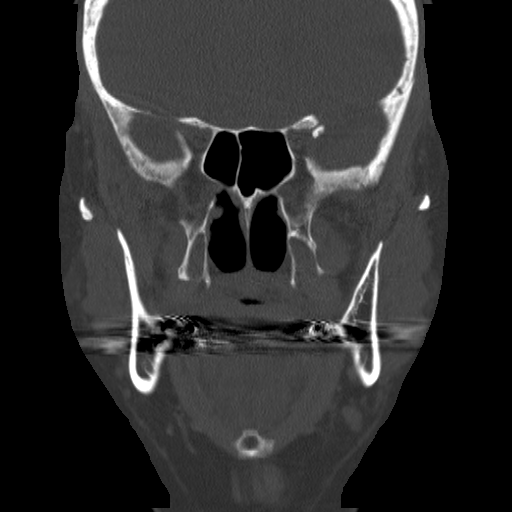
[im 56/84  bone]
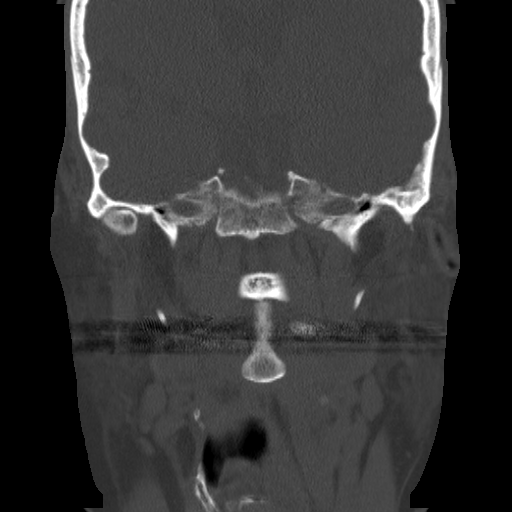

[Series 604: cor st facial · coronal · 0.34mm/px · 6 of 84 slices shown]
[im 12/84  bone]
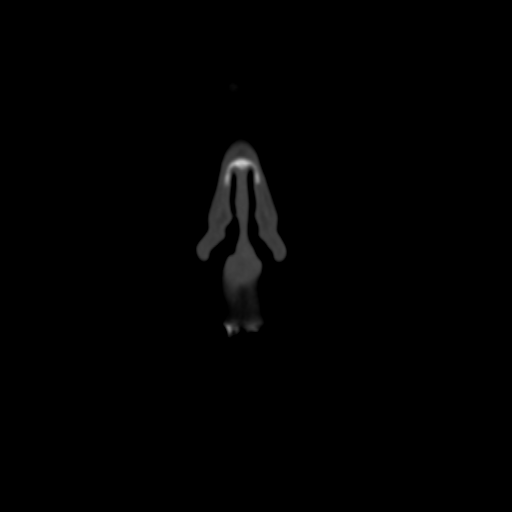
[im 24/84  bone]
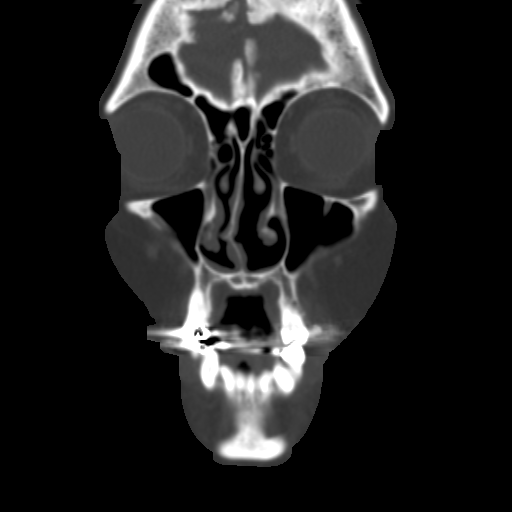
[im 36/84  bone]
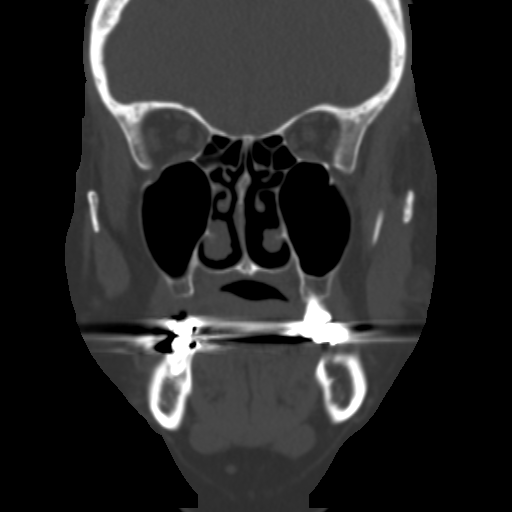
[im 48/84  bone]
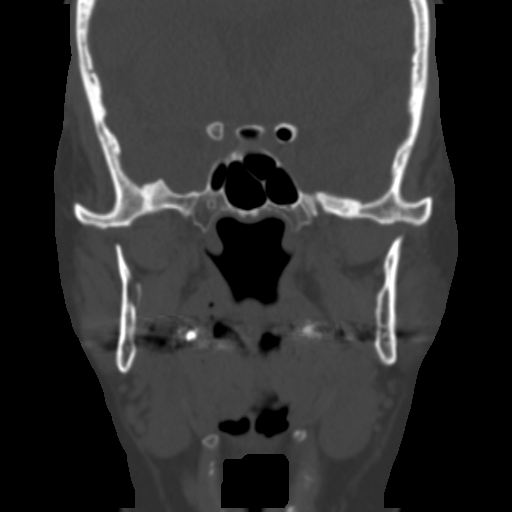
[im 60/84  bone]
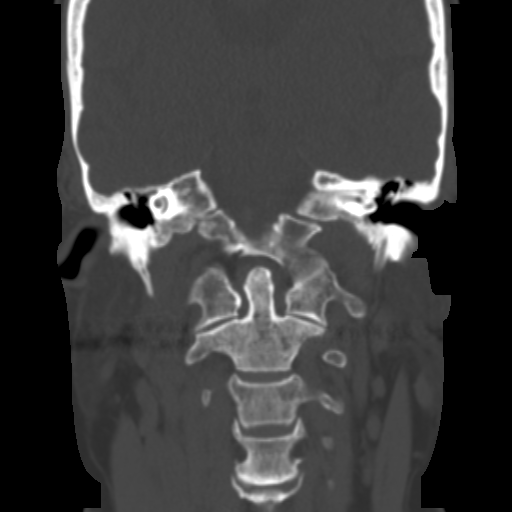
[im 72/84  bone]
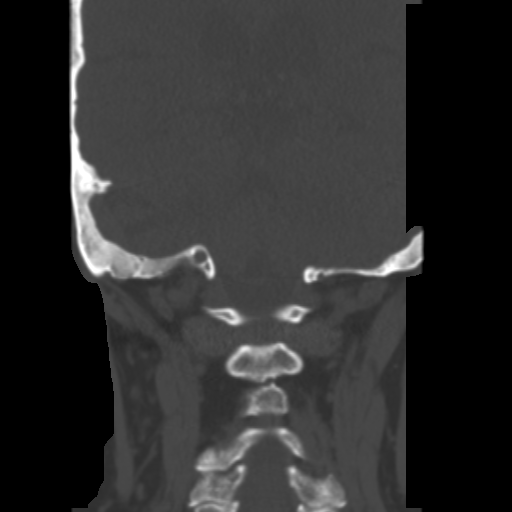

[Series 605: sag st facial · sagittal · 0.34mm/px · 3 of 77 slices shown]
[im 11/77  bone]
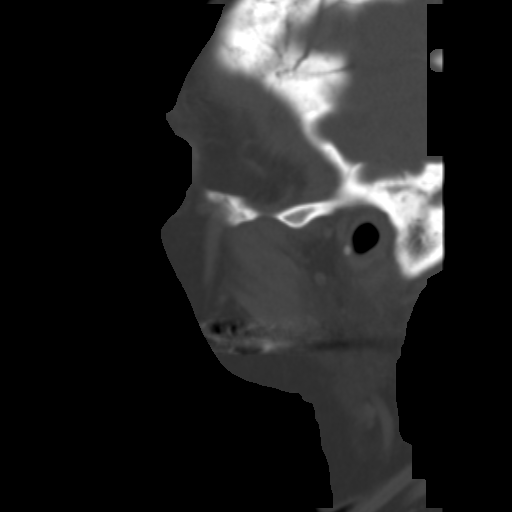
[im 22/77  bone]
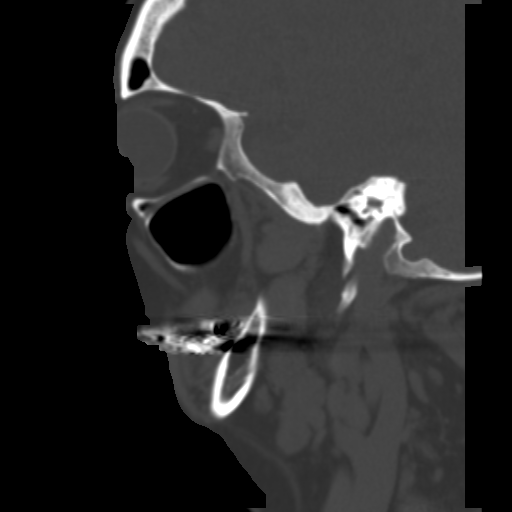
[im 33/77  bone]
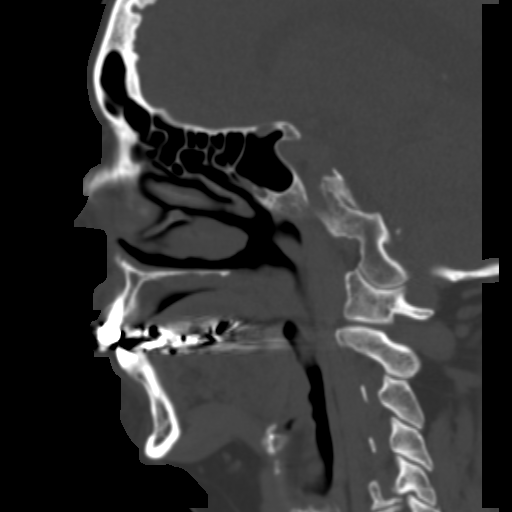

[17 of 37 positions shown; findings below may reference images not displayed]

FINDINGS: The paranasal sinuses are well pneumatized. No air-fluid level or
mucosal thickening is seen. The nasal septum deviates to the right
of midline compromising the right nasal airway somewhat but the left
nasal airway is widely patent. There is pneumatization of the left
middle terminate. No nasal bone fracture is seen, and no
maxillofacial fracture is evident. The mandible appears intact and
the mandibular condyles are in normal position with some
degenerative change present.
IMPRESSION: 1. No maxillofacial fracture is seen.
2. The paranasal sinuses are well pneumatized.
3. Compromise of the right nasal airway by nasal septal deviation to
the right of midline.

## 2017-10-14 ENCOUNTER — Telehealth: Payer: Self-pay | Admitting: Hematology

## 2017-10-14 ENCOUNTER — Encounter: Payer: Self-pay | Admitting: Hematology

## 2017-10-14 NOTE — Telephone Encounter (Signed)
New referral received from Dr. Orland Mustard at [facility] for dx of thrombocytosis. Pt has been scheduled to see Dr. Irene Limbo on 7/10 at 1pm. Pt aware to arrive 30 minutes early. Letter mailed.

## 2017-10-28 NOTE — Progress Notes (Signed)
HEMATOLOGY/ONCOLOGY CONSULTATION NOTE  Date of Service: 10/30/2017  Patient Care Team: Maurice Small, MD as PCP - General (Family Medicine)  CHIEF COMPLAINTS/PURPOSE OF CONSULTATION:  Thrombocytosis  HISTORY OF PRESENTING ILLNESS:   Amy Mcmahon is a wonderful 62 y.o. female who has been referred to [place] by Dr Maurice Small for evaluation and management of Thrombocytosis. The pt reports that she is doing well overall.   The pt reports that she has not previously had elevated platelets to her knowledge, and has had annual physicals with labs. She denies feeling any differently in the past 6 months as compared to a year ago. She had some diarrhea at the time of her last labs, and received empirical antibiotics. She notes that her her diarrhea resolved after a few days and denies recent surgeries, other recent infections. She adds that her bowel movements have occasionally been very loose, having recurring episodes of diarrhea. She notes some association of her GI symptoms with tomato sauce.   She had an endoscopy in the last month, which revealed two stomach ulcers that were negative for H. Pylori and she takes 40mg  Prilosec BID.   She uses an estrogen patch and has used Provera for about 7 years. She denies taking any steroids recently. She notes that her PCP Dr Justin Mend is manage her estrogen replacement.   She notes that her migraines used to occur prior to beginning her periods. She has used Metoprolol to treat her migraines and Maxalt as well. She adds that her migraines have lessened as she has gotten older.   She notes a history of iron deficiency anemia when she was very young.   Most recent lab results (10/09/17) of CBC  is as follows: all values are WNL except for PLT at 578k, MPV at 7.2.  On review of systems, pt reports intermittent diarrhea, good energy levels, and denies new pain along the spine, abdominal pains, leg swelling, and any other symptoms.   On PMHx the pt reports  menstrual migraines, insomnia, lumbar scoliosis, disc herniation, peptic ulcer, GERD, iron deficiency, cholecystectomy and denies splenectomy, and any blood clots. On Social Hx the pt denies ever smoking.  On Family Hx the pt reports cousin in 80s with high platelets. Mother with stroke at 53 and Father with strokes at 53 and 93, both parents in the setting of HTN.  MEDICAL HISTORY:  History reviewed. No pertinent past medical history.  SURGICAL HISTORY: History reviewed. No pertinent surgical history.  SOCIAL HISTORY: Social History   Socioeconomic History  . Marital status: Single    Spouse name: Not on file  . Number of children: Not on file  . Years of education: Not on file  . Highest education level: Not on file  Occupational History  . Not on file  Social Needs  . Financial resource strain: Not on file  . Food insecurity:    Worry: Not on file    Inability: Not on file  . Transportation needs:    Medical: Not on file    Non-medical: Not on file  Tobacco Use  . Smoking status: Never Smoker  . Smokeless tobacco: Never Used  Substance and Sexual Activity  . Alcohol use: Yes    Comment: social  . Drug use: Never  . Sexual activity: Not Currently  Lifestyle  . Physical activity:    Days per week: Not on file    Minutes per session: Not on file  . Stress: Not on file  Relationships  .  Social connections:    Talks on phone: Not on file    Gets together: Not on file    Attends religious service: Not on file    Active member of club or organization: Not on file    Attends meetings of clubs or organizations: Not on file    Relationship status: Not on file  . Intimate partner violence:    Fear of current or ex partner: Not on file    Emotionally abused: Not on file    Physically abused: Not on file    Forced sexual activity: Not on file  Other Topics Concern  . Not on file  Social History Narrative  . Not on file    FAMILY HISTORY: History reviewed. No  pertinent family history.  ALLERGIES:  is allergic to codeine.  MEDICATIONS:  Current Outpatient Medications  Medication Sig Dispense Refill  . omeprazole (PRILOSEC) 40 MG capsule Take 40 mg by mouth daily.    Marland Kitchen estradiol (VIVELLE-DOT) 0.025 MG/24HR Place 1 patch onto the skin once a week.     Marland Kitchen imipramine (TOFRANIL) 50 MG tablet Take 50 mg by mouth at bedtime.    . medroxyPROGESTERone (PROVERA) 2.5 MG tablet Take 2.5 mg by mouth daily.    . metoprolol succinate (TOPROL-XL) 100 MG 24 hr tablet Take 100 mg by mouth daily. Take with or immediately following a meal.    . ondansetron (ZOFRAN ODT) 4 MG disintegrating tablet 1-2 tabs ODT q8hrs prn nausea/vomiting 20 tablet 0  . promethazine (PHENERGAN) 25 MG tablet Take 25 mg by mouth every 6 (six) hours as needed for nausea or vomiting.    . rizatriptan (MAXALT) 10 MG tablet Take 10 mg by mouth as needed for migraine. May repeat in 2 hours if needed     No current facility-administered medications for this visit.     REVIEW OF SYSTEMS:    10 Point review of Systems was done is negative except as noted above.  PHYSICAL EXAMINATION:  . Vitals:   10/30/17 1307  BP: (!) 143/76  Pulse: 64  Resp: 18  Temp: 98.6 F (37 C)  SpO2: 99%   Filed Weights   10/30/17 1307  Weight: 160 lb 4.8 oz (72.7 kg)   .Body mass index is 27.52 kg/m.  GENERAL:alert, in no acute distress and comfortable SKIN: no acute rashes, no significant lesions EYES: conjunctiva are pink and non-injected, sclera anicteric OROPHARYNX: MMM, no exudates, no oropharyngeal erythema or ulceration NECK: supple, no JVD LYMPH:  no palpable lymphadenopathy in the cervical, axillary or inguinal regions LUNGS: clear to auscultation b/l with normal respiratory effort HEART: regular rate & rhythm ABDOMEN:  normoactive bowel sounds , non tender, not distended. Extremity: no pedal edema PSYCH: alert & oriented x 3 with fluent speech NEURO: no focal motor/sensory  deficits  LABORATORY DATA:  I have reviewed the data as listed  . CBC Latest Ref Rng & Units 10/30/2017  WBC 3.9 - 10.3 K/uL 7.1  Hemoglobin 11.6 - 15.9 g/dL 13.2  Hematocrit 34.8 - 46.6 % 39.8  Platelets 145 - 400 K/uL 526(H)    . CMP Latest Ref Rng & Units 10/30/2017  Glucose 70 - 99 mg/dL 89  BUN 8 - 23 mg/dL 15  Creatinine 0.44 - 1.00 mg/dL 0.75  Sodium 135 - 145 mmol/L 141  Potassium 3.5 - 5.1 mmol/L 3.9  Chloride 98 - 111 mmol/L 106  CO2 22 - 32 mmol/L 28  Calcium 8.9 - 10.3 mg/dL 8.9  Total Protein  6.5 - 8.1 g/dL 7.5  Total Bilirubin 0.3 - 1.2 mg/dL 0.8  Alkaline Phos 38 - 126 U/L 53  AST 15 - 41 U/L 15  ALT 0 - 44 U/L 14   . Lab Results  Component Value Date   IRON 74 10/30/2017   TIBC 321 10/30/2017   IRONPCTSAT 23 10/30/2017   (Iron and TIBC)  Lab Results  Component Value Date   FERRITIN 45 10/30/2017      10/09/17 CBC w/diff:     RADIOGRAPHIC STUDIES: I have personally reviewed the radiological images as listed and agreed with the findings in the report. No results found.  ASSESSMENT & PLAN:  62 y.o. female with  1. Thrombocytosis Plan -Discussed patient's most recent labs from 10/09/17, PLT at 578k -No smoking history, reactive process?  -Will check for myeloproliferative disorders to r/o Essential Thrombocytosis -Discussed the increased risk of blood clots associated with thrombocytosis -Will collect blood tests today and send out genetic tests-sent results pending. -Recommend that pt and PCP discuss benefits of estrogen replacement as this can increase the risk of blood clots -Continue follow up with PCP for age appropriate cancer screening   2.Iron deficiency . Lab Results  Component Value Date   IRON 74 10/30/2017   TIBC 321 10/30/2017   IRONPCTSAT 23 10/30/2017   (Iron and TIBC)  Lab Results  Component Value Date   FERRITIN 45 10/30/2017   Labs today RTC with Dr Irene Limbo as per labs   All of the patients questions were  answered with apparent satisfaction. The patient knows to call the clinic with any problems, questions or concerns.  The total time spent in the appt was 40 minutes and more than 50% was on counseling and direct patient cares.    Sullivan Lone MD MS AAHIVMS [facility] Hematology/Oncology Physician [facility]  (Office):       [phone] (Work cell):  [phone] (Fax):           [phone]  10/30/2017 1:48 PM  I, Baldwin [place], am acting as a Education administrator for Dr Irene Limbo.   .I have reviewed the above documentation for accuracy and completeness, and I agree with the above. Brunetta Genera MD

## 2017-10-30 ENCOUNTER — Encounter: Payer: Self-pay | Admitting: Hematology

## 2017-10-30 ENCOUNTER — Inpatient Hospital Stay: Payer: BC Managed Care – PPO

## 2017-10-30 ENCOUNTER — Inpatient Hospital Stay: Payer: BC Managed Care – PPO | Attending: Hematology | Admitting: Hematology

## 2017-10-30 VITALS — BP 143/76 | HR 64 | Temp 98.6°F | Resp 18 | Ht 64.0 in | Wt 160.3 lb

## 2017-10-30 DIAGNOSIS — G4700 Insomnia, unspecified: Secondary | ICD-10-CM | POA: Diagnosis not present

## 2017-10-30 DIAGNOSIS — K219 Gastro-esophageal reflux disease without esophagitis: Secondary | ICD-10-CM

## 2017-10-30 DIAGNOSIS — D473 Essential (hemorrhagic) thrombocythemia: Secondary | ICD-10-CM | POA: Diagnosis not present

## 2017-10-30 DIAGNOSIS — M419 Scoliosis, unspecified: Secondary | ICD-10-CM | POA: Insufficient documentation

## 2017-10-30 DIAGNOSIS — D509 Iron deficiency anemia, unspecified: Secondary | ICD-10-CM | POA: Diagnosis not present

## 2017-10-30 DIAGNOSIS — R197 Diarrhea, unspecified: Secondary | ICD-10-CM | POA: Insufficient documentation

## 2017-10-30 DIAGNOSIS — D75839 Thrombocytosis, unspecified: Secondary | ICD-10-CM

## 2017-10-30 DIAGNOSIS — Z823 Family history of stroke: Secondary | ICD-10-CM | POA: Diagnosis not present

## 2017-10-30 DIAGNOSIS — Z8249 Family history of ischemic heart disease and other diseases of the circulatory system: Secondary | ICD-10-CM | POA: Insufficient documentation

## 2017-10-30 DIAGNOSIS — Z79899 Other long term (current) drug therapy: Secondary | ICD-10-CM

## 2017-10-30 LAB — CBC WITH DIFFERENTIAL/PLATELET
BASOS ABS: 0 10*3/uL (ref 0.0–0.1)
Basophils Relative: 1 %
EOS PCT: 3 %
Eosinophils Absolute: 0.2 10*3/uL (ref 0.0–0.5)
HCT: 39.8 % (ref 34.8–46.6)
Hemoglobin: 13.2 g/dL (ref 11.6–15.9)
LYMPHS ABS: 1.6 10*3/uL (ref 0.9–3.3)
LYMPHS PCT: 22 %
MCH: 28.8 pg (ref 25.1–34.0)
MCHC: 33.2 g/dL (ref 31.5–36.0)
MCV: 86.9 fL (ref 79.5–101.0)
MONO ABS: 0.9 10*3/uL (ref 0.1–0.9)
Monocytes Relative: 13 %
Neutro Abs: 4.4 10*3/uL (ref 1.5–6.5)
Neutrophils Relative %: 61 %
PLATELETS: 526 10*3/uL — AB (ref 145–400)
RBC: 4.58 MIL/uL (ref 3.70–5.45)
RDW: 13.7 % (ref 11.2–14.5)
WBC: 7.1 10*3/uL (ref 3.9–10.3)

## 2017-10-30 LAB — IRON AND TIBC
Iron: 74 ug/dL (ref 41–142)
Saturation Ratios: 23 % (ref 21–57)
TIBC: 321 ug/dL (ref 236–444)
UIBC: 247 ug/dL

## 2017-10-30 LAB — CMP (CANCER CENTER ONLY)
ALBUMIN: 3.8 g/dL (ref 3.5–5.0)
ALK PHOS: 53 U/L (ref 38–126)
ALT: 14 U/L (ref 0–44)
AST: 15 U/L (ref 15–41)
Anion gap: 7 (ref 5–15)
BUN: 15 mg/dL (ref 8–23)
CALCIUM: 8.9 mg/dL (ref 8.9–10.3)
CHLORIDE: 106 mmol/L (ref 98–111)
CO2: 28 mmol/L (ref 22–32)
CREATININE: 0.75 mg/dL (ref 0.44–1.00)
GFR, Est AFR Am: >60 mL/min (ref >60)
GFR, Estimated: >60 mL/min (ref >60)
GLUCOSE: 89 mg/dL (ref 70–99)
Potassium: 3.9 mmol/L (ref 3.5–5.1)
SODIUM: 141 mmol/L (ref 135–145)
Total Bilirubin: 0.8 mg/dL (ref 0.3–1.2)
Total Protein: 7.5 g/dL (ref 6.5–8.1)

## 2017-10-30 LAB — RETICULOCYTES
RBC.: 4.58 MIL/uL (ref 3.70–5.45)
Retic Count, Absolute: 45.8 10*3/uL (ref 33.7–90.7)
Retic Ct Pct: 1 % (ref 0.7–2.1)

## 2017-10-30 LAB — SEDIMENTATION RATE: Sed Rate: 15 mm/hr (ref 0–22)

## 2017-10-30 LAB — FERRITIN: FERRITIN: 45 ng/mL (ref 11–307)

## 2017-11-22 LAB — JAK2 (INCLUDING V617F AND EXON 12), MPL,& CALR W/RFL MPN PANEL (NGS)

## 2018-12-15 ENCOUNTER — Other Ambulatory Visit: Payer: Self-pay | Admitting: Radiology

## 2020-05-06 ENCOUNTER — Other Ambulatory Visit: Payer: Self-pay | Admitting: Family Medicine

## 2020-05-06 ENCOUNTER — Ambulatory Visit
Admission: RE | Admit: 2020-05-06 | Discharge: 2020-05-06 | Disposition: A | Discharge status: Home / Self Care | Payer: Medicare PPO | Source: Ambulatory Visit | Attending: Family Medicine | Admitting: Family Medicine

## 2020-05-06 ENCOUNTER — Other Ambulatory Visit: Payer: Self-pay

## 2020-05-06 DIAGNOSIS — R52 Pain, unspecified: Secondary | ICD-10-CM

## 2020-08-18 DIAGNOSIS — K640 First degree hemorrhoids: Secondary | ICD-10-CM | POA: Diagnosis not present

## 2020-08-18 DIAGNOSIS — Z1211 Encounter for screening for malignant neoplasm of colon: Secondary | ICD-10-CM | POA: Diagnosis not present

## 2020-09-06 DIAGNOSIS — R1310 Dysphagia, unspecified: Secondary | ICD-10-CM | POA: Diagnosis not present

## 2020-09-06 DIAGNOSIS — K222 Esophageal obstruction: Secondary | ICD-10-CM | POA: Diagnosis not present

## 2020-11-01 DIAGNOSIS — K222 Esophageal obstruction: Secondary | ICD-10-CM | POA: Diagnosis not present

## 2020-11-01 DIAGNOSIS — R1310 Dysphagia, unspecified: Secondary | ICD-10-CM | POA: Diagnosis not present

## 2021-01-23 DIAGNOSIS — Z1231 Encounter for screening mammogram for malignant neoplasm of breast: Secondary | ICD-10-CM | POA: Diagnosis not present

## 2021-02-07 DIAGNOSIS — Z79899 Other long term (current) drug therapy: Secondary | ICD-10-CM | POA: Diagnosis not present

## 2021-02-07 DIAGNOSIS — D473 Essential (hemorrhagic) thrombocythemia: Secondary | ICD-10-CM | POA: Diagnosis not present

## 2021-02-07 DIAGNOSIS — I491 Atrial premature depolarization: Secondary | ICD-10-CM | POA: Diagnosis not present

## 2021-02-07 DIAGNOSIS — Z23 Encounter for immunization: Secondary | ICD-10-CM | POA: Diagnosis not present

## 2021-02-07 DIAGNOSIS — Z1159 Encounter for screening for other viral diseases: Secondary | ICD-10-CM | POA: Diagnosis not present

## 2021-02-07 DIAGNOSIS — E2839 Other primary ovarian failure: Secondary | ICD-10-CM | POA: Diagnosis not present

## 2021-02-07 DIAGNOSIS — R7301 Impaired fasting glucose: Secondary | ICD-10-CM | POA: Diagnosis not present

## 2021-02-07 DIAGNOSIS — I10 Essential (primary) hypertension: Secondary | ICD-10-CM | POA: Diagnosis not present

## 2021-02-07 DIAGNOSIS — M4180 Other forms of scoliosis, site unspecified: Secondary | ICD-10-CM | POA: Diagnosis not present

## 2021-02-07 DIAGNOSIS — Z0000 Encounter for general adult medical examination without abnormal findings: Secondary | ICD-10-CM | POA: Diagnosis not present

## 2021-02-15 DIAGNOSIS — Z780 Asymptomatic menopausal state: Secondary | ICD-10-CM | POA: Diagnosis not present

## 2021-02-15 DIAGNOSIS — M85851 Other specified disorders of bone density and structure, right thigh: Secondary | ICD-10-CM | POA: Diagnosis not present

## 2021-02-15 DIAGNOSIS — M85852 Other specified disorders of bone density and structure, left thigh: Secondary | ICD-10-CM | POA: Diagnosis not present

## 2021-03-10 DIAGNOSIS — K219 Gastro-esophageal reflux disease without esophagitis: Secondary | ICD-10-CM | POA: Diagnosis not present

## 2021-03-10 DIAGNOSIS — R059 Cough, unspecified: Secondary | ICD-10-CM | POA: Diagnosis not present

## 2021-07-17 DIAGNOSIS — S5012XA Contusion of left forearm, initial encounter: Secondary | ICD-10-CM | POA: Diagnosis not present

## 2021-07-17 DIAGNOSIS — W540XXA Bitten by dog, initial encounter: Secondary | ICD-10-CM | POA: Diagnosis not present

## 2021-10-27 DIAGNOSIS — G43909 Migraine, unspecified, not intractable, without status migrainosus: Secondary | ICD-10-CM | POA: Diagnosis not present

## 2021-10-27 DIAGNOSIS — K219 Gastro-esophageal reflux disease without esophagitis: Secondary | ICD-10-CM | POA: Diagnosis not present

## 2021-10-27 DIAGNOSIS — I10 Essential (primary) hypertension: Secondary | ICD-10-CM | POA: Diagnosis not present

## 2021-10-27 DIAGNOSIS — Z809 Family history of malignant neoplasm, unspecified: Secondary | ICD-10-CM | POA: Diagnosis not present

## 2021-10-27 DIAGNOSIS — Z8249 Family history of ischemic heart disease and other diseases of the circulatory system: Secondary | ICD-10-CM | POA: Diagnosis not present

## 2021-10-27 DIAGNOSIS — Z823 Family history of stroke: Secondary | ICD-10-CM | POA: Diagnosis not present

## 2021-10-27 DIAGNOSIS — Z833 Family history of diabetes mellitus: Secondary | ICD-10-CM | POA: Diagnosis not present

## 2021-10-27 DIAGNOSIS — Z885 Allergy status to narcotic agent status: Secondary | ICD-10-CM | POA: Diagnosis not present

## 2022-01-29 DIAGNOSIS — Z1231 Encounter for screening mammogram for malignant neoplasm of breast: Secondary | ICD-10-CM | POA: Diagnosis not present

## 2022-02-13 DIAGNOSIS — L237 Allergic contact dermatitis due to plants, except food: Secondary | ICD-10-CM | POA: Diagnosis not present

## 2022-03-01 DIAGNOSIS — L259 Unspecified contact dermatitis, unspecified cause: Secondary | ICD-10-CM | POA: Diagnosis not present

## 2022-03-01 DIAGNOSIS — E785 Hyperlipidemia, unspecified: Secondary | ICD-10-CM | POA: Diagnosis not present

## 2022-03-01 DIAGNOSIS — Z0000 Encounter for general adult medical examination without abnormal findings: Secondary | ICD-10-CM | POA: Diagnosis not present

## 2022-03-01 DIAGNOSIS — I491 Atrial premature depolarization: Secondary | ICD-10-CM | POA: Diagnosis not present

## 2022-03-01 DIAGNOSIS — I10 Essential (primary) hypertension: Secondary | ICD-10-CM | POA: Diagnosis not present

## 2022-03-01 DIAGNOSIS — M4180 Other forms of scoliosis, site unspecified: Secondary | ICD-10-CM | POA: Diagnosis not present

## 2022-03-01 DIAGNOSIS — D473 Essential (hemorrhagic) thrombocythemia: Secondary | ICD-10-CM | POA: Diagnosis not present

## 2022-03-01 DIAGNOSIS — Z23 Encounter for immunization: Secondary | ICD-10-CM | POA: Diagnosis not present

## 2022-03-01 DIAGNOSIS — R7303 Prediabetes: Secondary | ICD-10-CM | POA: Diagnosis not present

## 2022-03-19 IMAGING — CR DG FOREARM 2V*L*
2 series · 2 of 2 positions shown · non-contrast
Comparison: None.

CLINICAL DATA: Pain and swelling distal forearm

EXAM:
LEFT FOREARM - 2 VIEW

[x forearm ap left]
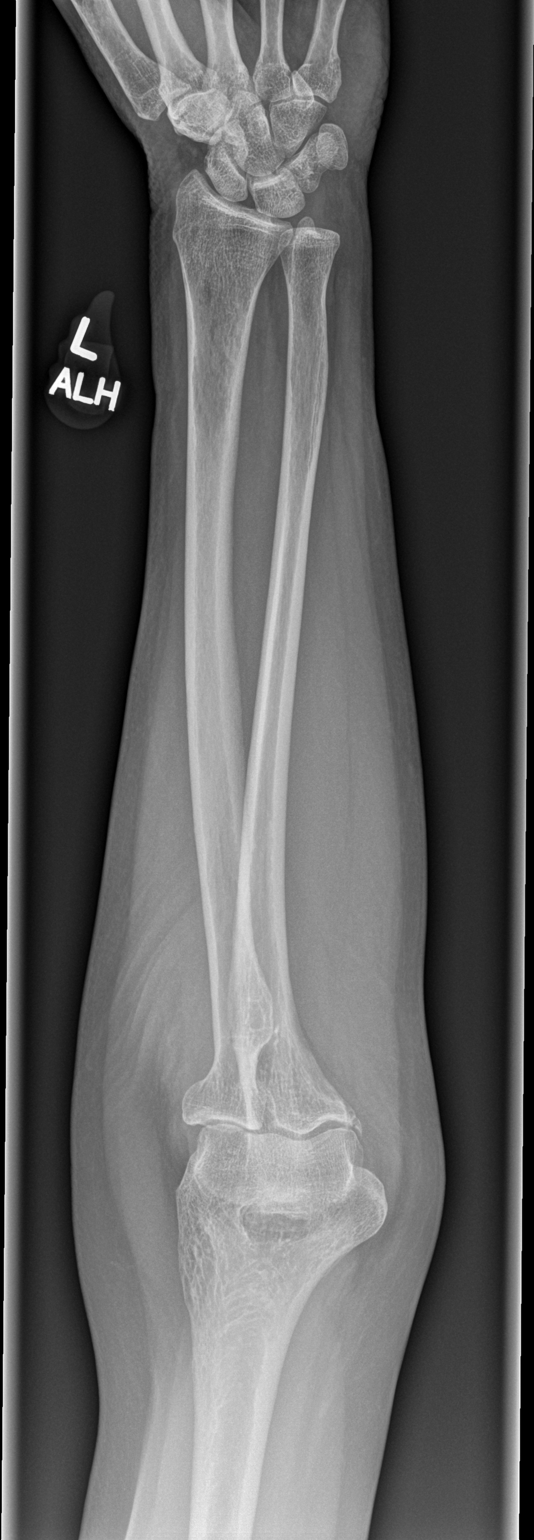

[x forearm lat left]
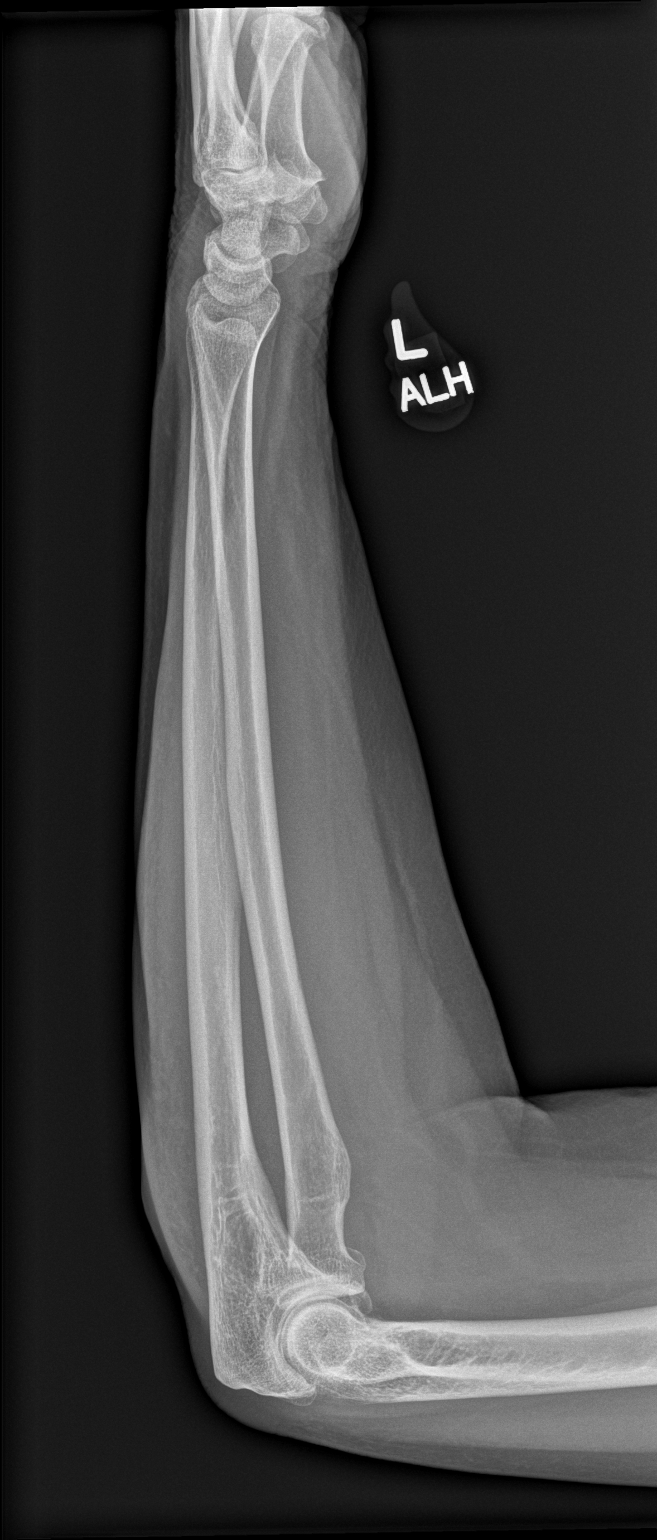

[2 of 2 positions shown; findings below may reference images not displayed]

FINDINGS: There is no evidence of fracture or other focal bone lesions. Soft
tissues are unremarkable.
IMPRESSION: Negative.

## 2022-04-19 DIAGNOSIS — U071 COVID-19: Secondary | ICD-10-CM | POA: Diagnosis not present

## 2022-06-18 DIAGNOSIS — M542 Cervicalgia: Secondary | ICD-10-CM | POA: Diagnosis not present

## 2022-06-18 DIAGNOSIS — M9903 Segmental and somatic dysfunction of lumbar region: Secondary | ICD-10-CM | POA: Diagnosis not present

## 2022-06-18 DIAGNOSIS — M5451 Vertebrogenic low back pain: Secondary | ICD-10-CM | POA: Diagnosis not present

## 2022-06-18 DIAGNOSIS — M9902 Segmental and somatic dysfunction of thoracic region: Secondary | ICD-10-CM | POA: Diagnosis not present

## 2022-06-20 DIAGNOSIS — M9902 Segmental and somatic dysfunction of thoracic region: Secondary | ICD-10-CM | POA: Diagnosis not present

## 2022-06-20 DIAGNOSIS — M9903 Segmental and somatic dysfunction of lumbar region: Secondary | ICD-10-CM | POA: Diagnosis not present

## 2022-06-20 DIAGNOSIS — M5451 Vertebrogenic low back pain: Secondary | ICD-10-CM | POA: Diagnosis not present

## 2022-06-20 DIAGNOSIS — M542 Cervicalgia: Secondary | ICD-10-CM | POA: Diagnosis not present

## 2022-06-22 DIAGNOSIS — M9902 Segmental and somatic dysfunction of thoracic region: Secondary | ICD-10-CM | POA: Diagnosis not present

## 2022-06-22 DIAGNOSIS — M9903 Segmental and somatic dysfunction of lumbar region: Secondary | ICD-10-CM | POA: Diagnosis not present

## 2022-06-22 DIAGNOSIS — M5451 Vertebrogenic low back pain: Secondary | ICD-10-CM | POA: Diagnosis not present

## 2022-06-22 DIAGNOSIS — M542 Cervicalgia: Secondary | ICD-10-CM | POA: Diagnosis not present

## 2022-06-25 DIAGNOSIS — M542 Cervicalgia: Secondary | ICD-10-CM | POA: Diagnosis not present

## 2022-06-25 DIAGNOSIS — M5451 Vertebrogenic low back pain: Secondary | ICD-10-CM | POA: Diagnosis not present

## 2022-06-25 DIAGNOSIS — M9903 Segmental and somatic dysfunction of lumbar region: Secondary | ICD-10-CM | POA: Diagnosis not present

## 2022-06-25 DIAGNOSIS — M9902 Segmental and somatic dysfunction of thoracic region: Secondary | ICD-10-CM | POA: Diagnosis not present

## 2022-06-27 DIAGNOSIS — M9903 Segmental and somatic dysfunction of lumbar region: Secondary | ICD-10-CM | POA: Diagnosis not present

## 2022-06-27 DIAGNOSIS — M5451 Vertebrogenic low back pain: Secondary | ICD-10-CM | POA: Diagnosis not present

## 2022-06-27 DIAGNOSIS — M542 Cervicalgia: Secondary | ICD-10-CM | POA: Diagnosis not present

## 2022-06-27 DIAGNOSIS — M9902 Segmental and somatic dysfunction of thoracic region: Secondary | ICD-10-CM | POA: Diagnosis not present

## 2022-06-29 DIAGNOSIS — M9903 Segmental and somatic dysfunction of lumbar region: Secondary | ICD-10-CM | POA: Diagnosis not present

## 2022-06-29 DIAGNOSIS — M9902 Segmental and somatic dysfunction of thoracic region: Secondary | ICD-10-CM | POA: Diagnosis not present

## 2022-06-29 DIAGNOSIS — M542 Cervicalgia: Secondary | ICD-10-CM | POA: Diagnosis not present

## 2022-06-29 DIAGNOSIS — M5451 Vertebrogenic low back pain: Secondary | ICD-10-CM | POA: Diagnosis not present

## 2022-07-02 DIAGNOSIS — M9903 Segmental and somatic dysfunction of lumbar region: Secondary | ICD-10-CM | POA: Diagnosis not present

## 2022-07-02 DIAGNOSIS — M542 Cervicalgia: Secondary | ICD-10-CM | POA: Diagnosis not present

## 2022-07-02 DIAGNOSIS — M5451 Vertebrogenic low back pain: Secondary | ICD-10-CM | POA: Diagnosis not present

## 2022-07-02 DIAGNOSIS — M9902 Segmental and somatic dysfunction of thoracic region: Secondary | ICD-10-CM | POA: Diagnosis not present

## 2022-07-04 DIAGNOSIS — M9902 Segmental and somatic dysfunction of thoracic region: Secondary | ICD-10-CM | POA: Diagnosis not present

## 2022-07-04 DIAGNOSIS — M5451 Vertebrogenic low back pain: Secondary | ICD-10-CM | POA: Diagnosis not present

## 2022-07-04 DIAGNOSIS — M9903 Segmental and somatic dysfunction of lumbar region: Secondary | ICD-10-CM | POA: Diagnosis not present

## 2022-07-04 DIAGNOSIS — M542 Cervicalgia: Secondary | ICD-10-CM | POA: Diagnosis not present

## 2022-07-06 DIAGNOSIS — M9903 Segmental and somatic dysfunction of lumbar region: Secondary | ICD-10-CM | POA: Diagnosis not present

## 2022-07-06 DIAGNOSIS — M542 Cervicalgia: Secondary | ICD-10-CM | POA: Diagnosis not present

## 2022-07-06 DIAGNOSIS — M9902 Segmental and somatic dysfunction of thoracic region: Secondary | ICD-10-CM | POA: Diagnosis not present

## 2022-07-06 DIAGNOSIS — M5451 Vertebrogenic low back pain: Secondary | ICD-10-CM | POA: Diagnosis not present

## 2022-07-09 DIAGNOSIS — M542 Cervicalgia: Secondary | ICD-10-CM | POA: Diagnosis not present

## 2022-07-09 DIAGNOSIS — M5451 Vertebrogenic low back pain: Secondary | ICD-10-CM | POA: Diagnosis not present

## 2022-07-09 DIAGNOSIS — M9902 Segmental and somatic dysfunction of thoracic region: Secondary | ICD-10-CM | POA: Diagnosis not present

## 2022-07-09 DIAGNOSIS — M9903 Segmental and somatic dysfunction of lumbar region: Secondary | ICD-10-CM | POA: Diagnosis not present

## 2022-07-11 DIAGNOSIS — M5451 Vertebrogenic low back pain: Secondary | ICD-10-CM | POA: Diagnosis not present

## 2022-07-11 DIAGNOSIS — M9903 Segmental and somatic dysfunction of lumbar region: Secondary | ICD-10-CM | POA: Diagnosis not present

## 2022-07-11 DIAGNOSIS — M9902 Segmental and somatic dysfunction of thoracic region: Secondary | ICD-10-CM | POA: Diagnosis not present

## 2022-07-11 DIAGNOSIS — M542 Cervicalgia: Secondary | ICD-10-CM | POA: Diagnosis not present

## 2022-07-12 DIAGNOSIS — M9903 Segmental and somatic dysfunction of lumbar region: Secondary | ICD-10-CM | POA: Diagnosis not present

## 2022-07-12 DIAGNOSIS — M9902 Segmental and somatic dysfunction of thoracic region: Secondary | ICD-10-CM | POA: Diagnosis not present

## 2022-07-12 DIAGNOSIS — M5451 Vertebrogenic low back pain: Secondary | ICD-10-CM | POA: Diagnosis not present

## 2022-07-12 DIAGNOSIS — M542 Cervicalgia: Secondary | ICD-10-CM | POA: Diagnosis not present

## 2022-07-16 DIAGNOSIS — M9903 Segmental and somatic dysfunction of lumbar region: Secondary | ICD-10-CM | POA: Diagnosis not present

## 2022-07-16 DIAGNOSIS — M9902 Segmental and somatic dysfunction of thoracic region: Secondary | ICD-10-CM | POA: Diagnosis not present

## 2022-07-16 DIAGNOSIS — M5451 Vertebrogenic low back pain: Secondary | ICD-10-CM | POA: Diagnosis not present

## 2022-07-16 DIAGNOSIS — M542 Cervicalgia: Secondary | ICD-10-CM | POA: Diagnosis not present

## 2022-07-20 DIAGNOSIS — M5451 Vertebrogenic low back pain: Secondary | ICD-10-CM | POA: Diagnosis not present

## 2022-07-20 DIAGNOSIS — M9902 Segmental and somatic dysfunction of thoracic region: Secondary | ICD-10-CM | POA: Diagnosis not present

## 2022-07-20 DIAGNOSIS — M542 Cervicalgia: Secondary | ICD-10-CM | POA: Diagnosis not present

## 2022-07-20 DIAGNOSIS — M9903 Segmental and somatic dysfunction of lumbar region: Secondary | ICD-10-CM | POA: Diagnosis not present

## 2022-07-23 DIAGNOSIS — M9902 Segmental and somatic dysfunction of thoracic region: Secondary | ICD-10-CM | POA: Diagnosis not present

## 2022-07-23 DIAGNOSIS — M5451 Vertebrogenic low back pain: Secondary | ICD-10-CM | POA: Diagnosis not present

## 2022-07-23 DIAGNOSIS — M9903 Segmental and somatic dysfunction of lumbar region: Secondary | ICD-10-CM | POA: Diagnosis not present

## 2022-07-23 DIAGNOSIS — M542 Cervicalgia: Secondary | ICD-10-CM | POA: Diagnosis not present

## 2022-07-27 DIAGNOSIS — M5451 Vertebrogenic low back pain: Secondary | ICD-10-CM | POA: Diagnosis not present

## 2022-07-27 DIAGNOSIS — M9902 Segmental and somatic dysfunction of thoracic region: Secondary | ICD-10-CM | POA: Diagnosis not present

## 2022-07-27 DIAGNOSIS — M9903 Segmental and somatic dysfunction of lumbar region: Secondary | ICD-10-CM | POA: Diagnosis not present

## 2022-07-27 DIAGNOSIS — M542 Cervicalgia: Secondary | ICD-10-CM | POA: Diagnosis not present

## 2022-07-30 DIAGNOSIS — M9902 Segmental and somatic dysfunction of thoracic region: Secondary | ICD-10-CM | POA: Diagnosis not present

## 2022-07-30 DIAGNOSIS — M542 Cervicalgia: Secondary | ICD-10-CM | POA: Diagnosis not present

## 2022-07-30 DIAGNOSIS — M5451 Vertebrogenic low back pain: Secondary | ICD-10-CM | POA: Diagnosis not present

## 2022-07-30 DIAGNOSIS — M9903 Segmental and somatic dysfunction of lumbar region: Secondary | ICD-10-CM | POA: Diagnosis not present

## 2022-08-03 DIAGNOSIS — M9902 Segmental and somatic dysfunction of thoracic region: Secondary | ICD-10-CM | POA: Diagnosis not present

## 2022-08-03 DIAGNOSIS — M9903 Segmental and somatic dysfunction of lumbar region: Secondary | ICD-10-CM | POA: Diagnosis not present

## 2022-08-03 DIAGNOSIS — M5451 Vertebrogenic low back pain: Secondary | ICD-10-CM | POA: Diagnosis not present

## 2022-08-03 DIAGNOSIS — M542 Cervicalgia: Secondary | ICD-10-CM | POA: Diagnosis not present

## 2022-08-06 DIAGNOSIS — M5451 Vertebrogenic low back pain: Secondary | ICD-10-CM | POA: Diagnosis not present

## 2022-08-06 DIAGNOSIS — M542 Cervicalgia: Secondary | ICD-10-CM | POA: Diagnosis not present

## 2022-08-06 DIAGNOSIS — M9903 Segmental and somatic dysfunction of lumbar region: Secondary | ICD-10-CM | POA: Diagnosis not present

## 2022-08-06 DIAGNOSIS — M9902 Segmental and somatic dysfunction of thoracic region: Secondary | ICD-10-CM | POA: Diagnosis not present

## 2022-08-10 DIAGNOSIS — M5451 Vertebrogenic low back pain: Secondary | ICD-10-CM | POA: Diagnosis not present

## 2022-08-10 DIAGNOSIS — M542 Cervicalgia: Secondary | ICD-10-CM | POA: Diagnosis not present

## 2022-08-10 DIAGNOSIS — M9902 Segmental and somatic dysfunction of thoracic region: Secondary | ICD-10-CM | POA: Diagnosis not present

## 2022-08-10 DIAGNOSIS — M9903 Segmental and somatic dysfunction of lumbar region: Secondary | ICD-10-CM | POA: Diagnosis not present

## 2022-08-13 DIAGNOSIS — M5451 Vertebrogenic low back pain: Secondary | ICD-10-CM | POA: Diagnosis not present

## 2022-08-13 DIAGNOSIS — M542 Cervicalgia: Secondary | ICD-10-CM | POA: Diagnosis not present

## 2022-08-13 DIAGNOSIS — M9902 Segmental and somatic dysfunction of thoracic region: Secondary | ICD-10-CM | POA: Diagnosis not present

## 2022-08-13 DIAGNOSIS — M9903 Segmental and somatic dysfunction of lumbar region: Secondary | ICD-10-CM | POA: Diagnosis not present

## 2022-08-17 DIAGNOSIS — M9902 Segmental and somatic dysfunction of thoracic region: Secondary | ICD-10-CM | POA: Diagnosis not present

## 2022-08-17 DIAGNOSIS — M9903 Segmental and somatic dysfunction of lumbar region: Secondary | ICD-10-CM | POA: Diagnosis not present

## 2022-08-17 DIAGNOSIS — M5451 Vertebrogenic low back pain: Secondary | ICD-10-CM | POA: Diagnosis not present

## 2022-08-17 DIAGNOSIS — M542 Cervicalgia: Secondary | ICD-10-CM | POA: Diagnosis not present

## 2022-08-20 DIAGNOSIS — M5451 Vertebrogenic low back pain: Secondary | ICD-10-CM | POA: Diagnosis not present

## 2022-08-20 DIAGNOSIS — M542 Cervicalgia: Secondary | ICD-10-CM | POA: Diagnosis not present

## 2022-08-20 DIAGNOSIS — M9902 Segmental and somatic dysfunction of thoracic region: Secondary | ICD-10-CM | POA: Diagnosis not present

## 2022-08-20 DIAGNOSIS — M9903 Segmental and somatic dysfunction of lumbar region: Secondary | ICD-10-CM | POA: Diagnosis not present

## 2022-08-24 DIAGNOSIS — M542 Cervicalgia: Secondary | ICD-10-CM | POA: Diagnosis not present

## 2022-08-24 DIAGNOSIS — M5451 Vertebrogenic low back pain: Secondary | ICD-10-CM | POA: Diagnosis not present

## 2022-08-24 DIAGNOSIS — M9903 Segmental and somatic dysfunction of lumbar region: Secondary | ICD-10-CM | POA: Diagnosis not present

## 2022-08-24 DIAGNOSIS — M9902 Segmental and somatic dysfunction of thoracic region: Secondary | ICD-10-CM | POA: Diagnosis not present

## 2022-08-27 DIAGNOSIS — M9902 Segmental and somatic dysfunction of thoracic region: Secondary | ICD-10-CM | POA: Diagnosis not present

## 2022-08-27 DIAGNOSIS — M5451 Vertebrogenic low back pain: Secondary | ICD-10-CM | POA: Diagnosis not present

## 2022-08-27 DIAGNOSIS — M542 Cervicalgia: Secondary | ICD-10-CM | POA: Diagnosis not present

## 2022-08-27 DIAGNOSIS — M9903 Segmental and somatic dysfunction of lumbar region: Secondary | ICD-10-CM | POA: Diagnosis not present

## 2022-09-03 DIAGNOSIS — M9903 Segmental and somatic dysfunction of lumbar region: Secondary | ICD-10-CM | POA: Diagnosis not present

## 2022-09-03 DIAGNOSIS — M5451 Vertebrogenic low back pain: Secondary | ICD-10-CM | POA: Diagnosis not present

## 2022-09-03 DIAGNOSIS — M9902 Segmental and somatic dysfunction of thoracic region: Secondary | ICD-10-CM | POA: Diagnosis not present

## 2022-09-03 DIAGNOSIS — M542 Cervicalgia: Secondary | ICD-10-CM | POA: Diagnosis not present

## 2022-09-07 DIAGNOSIS — M5451 Vertebrogenic low back pain: Secondary | ICD-10-CM | POA: Diagnosis not present

## 2022-09-07 DIAGNOSIS — M542 Cervicalgia: Secondary | ICD-10-CM | POA: Diagnosis not present

## 2022-09-07 DIAGNOSIS — M9902 Segmental and somatic dysfunction of thoracic region: Secondary | ICD-10-CM | POA: Diagnosis not present

## 2022-09-07 DIAGNOSIS — M9903 Segmental and somatic dysfunction of lumbar region: Secondary | ICD-10-CM | POA: Diagnosis not present

## 2022-09-12 DIAGNOSIS — M9902 Segmental and somatic dysfunction of thoracic region: Secondary | ICD-10-CM | POA: Diagnosis not present

## 2022-09-12 DIAGNOSIS — M9903 Segmental and somatic dysfunction of lumbar region: Secondary | ICD-10-CM | POA: Diagnosis not present

## 2022-09-12 DIAGNOSIS — M5451 Vertebrogenic low back pain: Secondary | ICD-10-CM | POA: Diagnosis not present

## 2022-09-12 DIAGNOSIS — M542 Cervicalgia: Secondary | ICD-10-CM | POA: Diagnosis not present

## 2022-09-19 DIAGNOSIS — M542 Cervicalgia: Secondary | ICD-10-CM | POA: Diagnosis not present

## 2022-09-19 DIAGNOSIS — M9903 Segmental and somatic dysfunction of lumbar region: Secondary | ICD-10-CM | POA: Diagnosis not present

## 2022-09-19 DIAGNOSIS — M5451 Vertebrogenic low back pain: Secondary | ICD-10-CM | POA: Diagnosis not present

## 2022-09-19 DIAGNOSIS — M9902 Segmental and somatic dysfunction of thoracic region: Secondary | ICD-10-CM | POA: Diagnosis not present

## 2022-09-26 DIAGNOSIS — M5451 Vertebrogenic low back pain: Secondary | ICD-10-CM | POA: Diagnosis not present

## 2022-09-26 DIAGNOSIS — M542 Cervicalgia: Secondary | ICD-10-CM | POA: Diagnosis not present

## 2022-09-26 DIAGNOSIS — M9902 Segmental and somatic dysfunction of thoracic region: Secondary | ICD-10-CM | POA: Diagnosis not present

## 2022-09-26 DIAGNOSIS — M9903 Segmental and somatic dysfunction of lumbar region: Secondary | ICD-10-CM | POA: Diagnosis not present

## 2022-10-03 DIAGNOSIS — M5451 Vertebrogenic low back pain: Secondary | ICD-10-CM | POA: Diagnosis not present

## 2022-10-03 DIAGNOSIS — M542 Cervicalgia: Secondary | ICD-10-CM | POA: Diagnosis not present

## 2022-10-03 DIAGNOSIS — M9902 Segmental and somatic dysfunction of thoracic region: Secondary | ICD-10-CM | POA: Diagnosis not present

## 2022-10-03 DIAGNOSIS — M9903 Segmental and somatic dysfunction of lumbar region: Secondary | ICD-10-CM | POA: Diagnosis not present

## 2022-10-10 DIAGNOSIS — M5451 Vertebrogenic low back pain: Secondary | ICD-10-CM | POA: Diagnosis not present

## 2022-10-10 DIAGNOSIS — M9902 Segmental and somatic dysfunction of thoracic region: Secondary | ICD-10-CM | POA: Diagnosis not present

## 2022-10-10 DIAGNOSIS — M9903 Segmental and somatic dysfunction of lumbar region: Secondary | ICD-10-CM | POA: Diagnosis not present

## 2022-10-10 DIAGNOSIS — M542 Cervicalgia: Secondary | ICD-10-CM | POA: Diagnosis not present

## 2022-10-17 DIAGNOSIS — M542 Cervicalgia: Secondary | ICD-10-CM | POA: Diagnosis not present

## 2022-10-17 DIAGNOSIS — M9903 Segmental and somatic dysfunction of lumbar region: Secondary | ICD-10-CM | POA: Diagnosis not present

## 2022-10-17 DIAGNOSIS — M9902 Segmental and somatic dysfunction of thoracic region: Secondary | ICD-10-CM | POA: Diagnosis not present

## 2022-10-17 DIAGNOSIS — M5451 Vertebrogenic low back pain: Secondary | ICD-10-CM | POA: Diagnosis not present

## 2022-10-31 DIAGNOSIS — M542 Cervicalgia: Secondary | ICD-10-CM | POA: Diagnosis not present

## 2022-10-31 DIAGNOSIS — M9903 Segmental and somatic dysfunction of lumbar region: Secondary | ICD-10-CM | POA: Diagnosis not present

## 2022-10-31 DIAGNOSIS — M9902 Segmental and somatic dysfunction of thoracic region: Secondary | ICD-10-CM | POA: Diagnosis not present

## 2022-10-31 DIAGNOSIS — M5451 Vertebrogenic low back pain: Secondary | ICD-10-CM | POA: Diagnosis not present

## 2022-11-14 DIAGNOSIS — M542 Cervicalgia: Secondary | ICD-10-CM | POA: Diagnosis not present

## 2022-11-14 DIAGNOSIS — M5451 Vertebrogenic low back pain: Secondary | ICD-10-CM | POA: Diagnosis not present

## 2022-11-14 DIAGNOSIS — M9902 Segmental and somatic dysfunction of thoracic region: Secondary | ICD-10-CM | POA: Diagnosis not present

## 2022-11-14 DIAGNOSIS — M9903 Segmental and somatic dysfunction of lumbar region: Secondary | ICD-10-CM | POA: Diagnosis not present

## 2022-11-28 DIAGNOSIS — M542 Cervicalgia: Secondary | ICD-10-CM | POA: Diagnosis not present

## 2022-11-28 DIAGNOSIS — M9902 Segmental and somatic dysfunction of thoracic region: Secondary | ICD-10-CM | POA: Diagnosis not present

## 2022-11-28 DIAGNOSIS — M5451 Vertebrogenic low back pain: Secondary | ICD-10-CM | POA: Diagnosis not present

## 2022-11-28 DIAGNOSIS — M9903 Segmental and somatic dysfunction of lumbar region: Secondary | ICD-10-CM | POA: Diagnosis not present

## 2022-12-12 DIAGNOSIS — M9902 Segmental and somatic dysfunction of thoracic region: Secondary | ICD-10-CM | POA: Diagnosis not present

## 2022-12-12 DIAGNOSIS — M5451 Vertebrogenic low back pain: Secondary | ICD-10-CM | POA: Diagnosis not present

## 2022-12-12 DIAGNOSIS — M542 Cervicalgia: Secondary | ICD-10-CM | POA: Diagnosis not present

## 2022-12-12 DIAGNOSIS — M9903 Segmental and somatic dysfunction of lumbar region: Secondary | ICD-10-CM | POA: Diagnosis not present

## 2022-12-26 DIAGNOSIS — M9902 Segmental and somatic dysfunction of thoracic region: Secondary | ICD-10-CM | POA: Diagnosis not present

## 2022-12-26 DIAGNOSIS — M9903 Segmental and somatic dysfunction of lumbar region: Secondary | ICD-10-CM | POA: Diagnosis not present

## 2022-12-26 DIAGNOSIS — M5451 Vertebrogenic low back pain: Secondary | ICD-10-CM | POA: Diagnosis not present

## 2022-12-26 DIAGNOSIS — M542 Cervicalgia: Secondary | ICD-10-CM | POA: Diagnosis not present

## 2023-01-09 DIAGNOSIS — M542 Cervicalgia: Secondary | ICD-10-CM | POA: Diagnosis not present

## 2023-01-09 DIAGNOSIS — M9902 Segmental and somatic dysfunction of thoracic region: Secondary | ICD-10-CM | POA: Diagnosis not present

## 2023-01-09 DIAGNOSIS — M9903 Segmental and somatic dysfunction of lumbar region: Secondary | ICD-10-CM | POA: Diagnosis not present

## 2023-01-09 DIAGNOSIS — M5451 Vertebrogenic low back pain: Secondary | ICD-10-CM | POA: Diagnosis not present

## 2023-01-23 DIAGNOSIS — M9903 Segmental and somatic dysfunction of lumbar region: Secondary | ICD-10-CM | POA: Diagnosis not present

## 2023-01-23 DIAGNOSIS — M9902 Segmental and somatic dysfunction of thoracic region: Secondary | ICD-10-CM | POA: Diagnosis not present

## 2023-01-23 DIAGNOSIS — M542 Cervicalgia: Secondary | ICD-10-CM | POA: Diagnosis not present

## 2023-01-23 DIAGNOSIS — M5451 Vertebrogenic low back pain: Secondary | ICD-10-CM | POA: Diagnosis not present

## 2023-02-04 DIAGNOSIS — Z1231 Encounter for screening mammogram for malignant neoplasm of breast: Secondary | ICD-10-CM | POA: Diagnosis not present

## 2023-02-06 DIAGNOSIS — M9903 Segmental and somatic dysfunction of lumbar region: Secondary | ICD-10-CM | POA: Diagnosis not present

## 2023-02-06 DIAGNOSIS — M9902 Segmental and somatic dysfunction of thoracic region: Secondary | ICD-10-CM | POA: Diagnosis not present

## 2023-02-06 DIAGNOSIS — M542 Cervicalgia: Secondary | ICD-10-CM | POA: Diagnosis not present

## 2023-02-06 DIAGNOSIS — M5451 Vertebrogenic low back pain: Secondary | ICD-10-CM | POA: Diagnosis not present

## 2023-02-20 DIAGNOSIS — M9903 Segmental and somatic dysfunction of lumbar region: Secondary | ICD-10-CM | POA: Diagnosis not present

## 2023-02-20 DIAGNOSIS — M5451 Vertebrogenic low back pain: Secondary | ICD-10-CM | POA: Diagnosis not present

## 2023-02-20 DIAGNOSIS — M9902 Segmental and somatic dysfunction of thoracic region: Secondary | ICD-10-CM | POA: Diagnosis not present

## 2023-02-20 DIAGNOSIS — M542 Cervicalgia: Secondary | ICD-10-CM | POA: Diagnosis not present

## 2023-03-06 DIAGNOSIS — M5451 Vertebrogenic low back pain: Secondary | ICD-10-CM | POA: Diagnosis not present

## 2023-03-06 DIAGNOSIS — M9902 Segmental and somatic dysfunction of thoracic region: Secondary | ICD-10-CM | POA: Diagnosis not present

## 2023-03-06 DIAGNOSIS — M9903 Segmental and somatic dysfunction of lumbar region: Secondary | ICD-10-CM | POA: Diagnosis not present

## 2023-03-06 DIAGNOSIS — M542 Cervicalgia: Secondary | ICD-10-CM | POA: Diagnosis not present

## 2023-03-11 DIAGNOSIS — E785 Hyperlipidemia, unspecified: Secondary | ICD-10-CM | POA: Diagnosis not present

## 2023-03-11 DIAGNOSIS — Z9181 History of falling: Secondary | ICD-10-CM | POA: Diagnosis not present

## 2023-03-11 DIAGNOSIS — E2839 Other primary ovarian failure: Secondary | ICD-10-CM | POA: Diagnosis not present

## 2023-03-11 DIAGNOSIS — I10 Essential (primary) hypertension: Secondary | ICD-10-CM | POA: Diagnosis not present

## 2023-03-11 DIAGNOSIS — Z23 Encounter for immunization: Secondary | ICD-10-CM | POA: Diagnosis not present

## 2023-03-11 DIAGNOSIS — Z0000 Encounter for general adult medical examination without abnormal findings: Secondary | ICD-10-CM | POA: Diagnosis not present

## 2023-03-11 DIAGNOSIS — R7303 Prediabetes: Secondary | ICD-10-CM | POA: Diagnosis not present

## 2023-03-11 DIAGNOSIS — Z79899 Other long term (current) drug therapy: Secondary | ICD-10-CM | POA: Diagnosis not present

## 2023-03-11 DIAGNOSIS — D473 Essential (hemorrhagic) thrombocythemia: Secondary | ICD-10-CM | POA: Diagnosis not present

## 2023-03-20 DIAGNOSIS — M9902 Segmental and somatic dysfunction of thoracic region: Secondary | ICD-10-CM | POA: Diagnosis not present

## 2023-03-20 DIAGNOSIS — M9903 Segmental and somatic dysfunction of lumbar region: Secondary | ICD-10-CM | POA: Diagnosis not present

## 2023-03-20 DIAGNOSIS — M5451 Vertebrogenic low back pain: Secondary | ICD-10-CM | POA: Diagnosis not present

## 2023-03-20 DIAGNOSIS — M542 Cervicalgia: Secondary | ICD-10-CM | POA: Diagnosis not present

## 2023-04-04 DIAGNOSIS — M5451 Vertebrogenic low back pain: Secondary | ICD-10-CM | POA: Diagnosis not present

## 2023-04-04 DIAGNOSIS — M9903 Segmental and somatic dysfunction of lumbar region: Secondary | ICD-10-CM | POA: Diagnosis not present

## 2023-04-04 DIAGNOSIS — M542 Cervicalgia: Secondary | ICD-10-CM | POA: Diagnosis not present

## 2023-04-04 DIAGNOSIS — M9902 Segmental and somatic dysfunction of thoracic region: Secondary | ICD-10-CM | POA: Diagnosis not present

## 2023-06-21 DIAGNOSIS — R7303 Prediabetes: Secondary | ICD-10-CM | POA: Diagnosis not present

## 2023-06-21 DIAGNOSIS — M5431 Sciatica, right side: Secondary | ICD-10-CM | POA: Diagnosis not present

## 2023-07-09 DIAGNOSIS — M5431 Sciatica, right side: Secondary | ICD-10-CM | POA: Diagnosis not present

## 2023-07-18 DIAGNOSIS — M47816 Spondylosis without myelopathy or radiculopathy, lumbar region: Secondary | ICD-10-CM | POA: Diagnosis not present

## 2023-07-18 DIAGNOSIS — M419 Scoliosis, unspecified: Secondary | ICD-10-CM | POA: Diagnosis not present

## 2023-07-18 DIAGNOSIS — M5416 Radiculopathy, lumbar region: Secondary | ICD-10-CM | POA: Diagnosis not present

## 2023-08-06 DIAGNOSIS — M545 Low back pain, unspecified: Secondary | ICD-10-CM | POA: Diagnosis not present

## 2023-08-15 DIAGNOSIS — M545 Low back pain, unspecified: Secondary | ICD-10-CM | POA: Diagnosis not present

## 2023-08-16 DIAGNOSIS — M5416 Radiculopathy, lumbar region: Secondary | ICD-10-CM | POA: Diagnosis not present

## 2023-08-16 DIAGNOSIS — M545 Low back pain, unspecified: Secondary | ICD-10-CM | POA: Diagnosis not present

## 2023-08-16 DIAGNOSIS — M419 Scoliosis, unspecified: Secondary | ICD-10-CM | POA: Diagnosis not present

## 2023-08-23 DIAGNOSIS — M545 Low back pain, unspecified: Secondary | ICD-10-CM | POA: Diagnosis not present

## 2023-08-30 DIAGNOSIS — M545 Low back pain, unspecified: Secondary | ICD-10-CM | POA: Diagnosis not present

## 2023-09-04 DIAGNOSIS — M545 Low back pain, unspecified: Secondary | ICD-10-CM | POA: Diagnosis not present

## 2023-09-09 DIAGNOSIS — M48062 Spinal stenosis, lumbar region with neurogenic claudication: Secondary | ICD-10-CM | POA: Diagnosis not present

## 2023-09-09 DIAGNOSIS — M4180 Other forms of scoliosis, site unspecified: Secondary | ICD-10-CM | POA: Diagnosis not present

## 2023-09-09 DIAGNOSIS — R600 Localized edema: Secondary | ICD-10-CM | POA: Diagnosis not present

## 2023-09-10 ENCOUNTER — Ambulatory Visit (HOSPITAL_COMMUNITY)
Admission: RE | Admit: 2023-09-10 | Discharge: 2023-09-10 | Disposition: A | Discharge status: Home / Self Care | Source: Ambulatory Visit | Attending: Family Medicine | Admitting: Family Medicine

## 2023-09-10 ENCOUNTER — Other Ambulatory Visit: Payer: Self-pay | Admitting: Family Medicine

## 2023-09-10 DIAGNOSIS — M7989 Other specified soft tissue disorders: Secondary | ICD-10-CM

## 2023-09-17 DIAGNOSIS — M5416 Radiculopathy, lumbar region: Secondary | ICD-10-CM | POA: Diagnosis not present

## 2023-09-17 DIAGNOSIS — M419 Scoliosis, unspecified: Secondary | ICD-10-CM | POA: Diagnosis not present

## 2023-09-18 DIAGNOSIS — M5416 Radiculopathy, lumbar region: Secondary | ICD-10-CM | POA: Diagnosis not present

## 2023-10-31 DIAGNOSIS — M5416 Radiculopathy, lumbar region: Secondary | ICD-10-CM | POA: Diagnosis not present

## 2023-10-31 DIAGNOSIS — M419 Scoliosis, unspecified: Secondary | ICD-10-CM | POA: Diagnosis not present

## 2023-11-27 DIAGNOSIS — M5416 Radiculopathy, lumbar region: Secondary | ICD-10-CM | POA: Diagnosis not present

## 2023-12-13 DIAGNOSIS — M5416 Radiculopathy, lumbar region: Secondary | ICD-10-CM | POA: Diagnosis not present

## 2023-12-13 DIAGNOSIS — M419 Scoliosis, unspecified: Secondary | ICD-10-CM | POA: Diagnosis not present

## 2023-12-13 DIAGNOSIS — M25471 Effusion, right ankle: Secondary | ICD-10-CM | POA: Diagnosis not present
